# Patient Record
Sex: Male | Born: 1980 | Race: White | Hispanic: No | Marital: Married | Smoking: Current every day smoker
Health system: Southern US, Community
[De-identification: ages and names within clinical notes are randomized; demographics above are authoritative.]

## PROBLEM LIST (undated history)

## (undated) DIAGNOSIS — G4730 Sleep apnea, unspecified: Secondary | ICD-10-CM

## (undated) DIAGNOSIS — F32A Depression, unspecified: Secondary | ICD-10-CM

## (undated) DIAGNOSIS — I10 Essential (primary) hypertension: Secondary | ICD-10-CM

## (undated) DIAGNOSIS — F419 Anxiety disorder, unspecified: Secondary | ICD-10-CM

## (undated) DIAGNOSIS — E119 Type 2 diabetes mellitus without complications: Secondary | ICD-10-CM

## (undated) HISTORY — PX: CHOLECYSTECTOMY: SHX55

## (undated) HISTORY — PX: CARPAL TUNNEL RELEASE: SHX101

## (undated) HISTORY — PX: WRIST TENOLYSIS: SHX1095

## (undated) HISTORY — PX: HEEL SPUR SURGERY: SHX665

---

## 2006-03-15 ENCOUNTER — Emergency Department (HOSPITAL_COMMUNITY): Admission: EM | Admit: 2006-03-15 | Discharge: 2006-03-16 | Payer: Self-pay | Admitting: Emergency Medicine

## 2013-07-03 HISTORY — PX: HERNIA REPAIR: SHX51

## 2016-12-22 ENCOUNTER — Emergency Department (HOSPITAL_COMMUNITY): Payer: 59

## 2016-12-22 ENCOUNTER — Encounter (HOSPITAL_COMMUNITY): Payer: Self-pay

## 2016-12-22 ENCOUNTER — Emergency Department (HOSPITAL_COMMUNITY)
Admission: EM | Admit: 2016-12-22 | Discharge: 2016-12-22 | Disposition: A | Discharge status: Home / Self Care | Payer: 59 | Attending: Emergency Medicine | Admitting: Emergency Medicine

## 2016-12-22 DIAGNOSIS — T07XXXA Unspecified multiple injuries, initial encounter: Secondary | ICD-10-CM

## 2016-12-22 DIAGNOSIS — R918 Other nonspecific abnormal finding of lung field: Secondary | ICD-10-CM | POA: Diagnosis not present

## 2016-12-22 DIAGNOSIS — Y939 Activity, unspecified: Secondary | ICD-10-CM | POA: Diagnosis not present

## 2016-12-22 DIAGNOSIS — F17200 Nicotine dependence, unspecified, uncomplicated: Secondary | ICD-10-CM | POA: Insufficient documentation

## 2016-12-22 DIAGNOSIS — Z79899 Other long term (current) drug therapy: Secondary | ICD-10-CM | POA: Insufficient documentation

## 2016-12-22 DIAGNOSIS — Y999 Unspecified external cause status: Secondary | ICD-10-CM | POA: Insufficient documentation

## 2016-12-22 DIAGNOSIS — K760 Fatty (change of) liver, not elsewhere classified: Secondary | ICD-10-CM | POA: Insufficient documentation

## 2016-12-22 DIAGNOSIS — R55 Syncope and collapse: Secondary | ICD-10-CM | POA: Diagnosis not present

## 2016-12-22 DIAGNOSIS — Y92410 Unspecified street and highway as the place of occurrence of the external cause: Secondary | ICD-10-CM | POA: Insufficient documentation

## 2016-12-22 DIAGNOSIS — S51812A Laceration without foreign body of left forearm, initial encounter: Secondary | ICD-10-CM | POA: Diagnosis not present

## 2016-12-22 DIAGNOSIS — S80212A Abrasion, left knee, initial encounter: Secondary | ICD-10-CM | POA: Diagnosis not present

## 2016-12-22 DIAGNOSIS — S0181XA Laceration without foreign body of other part of head, initial encounter: Secondary | ICD-10-CM | POA: Diagnosis not present

## 2016-12-22 DIAGNOSIS — I10 Essential (primary) hypertension: Secondary | ICD-10-CM | POA: Insufficient documentation

## 2016-12-22 DIAGNOSIS — S0101XA Laceration without foreign body of scalp, initial encounter: Secondary | ICD-10-CM | POA: Diagnosis not present

## 2016-12-22 DIAGNOSIS — S0990XA Unspecified injury of head, initial encounter: Secondary | ICD-10-CM | POA: Diagnosis present

## 2016-12-22 HISTORY — DX: Essential (primary) hypertension: I10

## 2016-12-22 LAB — URINALYSIS, ROUTINE W REFLEX MICROSCOPIC
Bilirubin Urine: NEGATIVE
GLUCOSE, UA: NEGATIVE mg/dL
HGB URINE DIPSTICK: NEGATIVE
Ketones, ur: NEGATIVE mg/dL
LEUKOCYTES UA: NEGATIVE
NITRITE: NEGATIVE
PH: 6 (ref 5.0–8.0)
Protein, ur: 100 mg/dL — AB
Specific Gravity, Urine: 1.027 (ref 1.005–1.030)

## 2016-12-22 LAB — I-STAT CHEM 8, ED
BUN: 10 mg/dL (ref 6–20)
BUN: 11 mg/dL (ref 6–20)
CALCIUM ION: 1.05 mmol/L — AB (ref 1.15–1.40)
CHLORIDE: 102 mmol/L (ref 101–111)
CREATININE: 0.9 mg/dL (ref 0.61–1.24)
CREATININE: 0.8 mg/dL (ref 0.61–1.24)
Calcium, Ion: 1.06 mmol/L — ABNORMAL LOW (ref 1.15–1.40)
Chloride: 102 mmol/L (ref 101–111)
GLUCOSE: 76 mg/dL (ref 65–99)
GLUCOSE: 80 mg/dL (ref 65–99)
HEMATOCRIT: 44 % (ref 39.0–52.0)
HEMATOCRIT: 41 % (ref 39.0–52.0)
HEMOGLOBIN: 15 g/dL (ref 13.0–17.0)
HEMOGLOBIN: 13.9 g/dL (ref 13.0–17.0)
POTASSIUM: 4 mmol/L (ref 3.5–5.1)
Potassium: 3.8 mmol/L (ref 3.5–5.1)
Sodium: 140 mmol/L (ref 135–145)
Sodium: 140 mmol/L (ref 135–145)
TCO2: 26 mmol/L (ref 0–100)
TCO2: 26 mmol/L (ref 0–100)

## 2016-12-22 LAB — CBC
HCT: 43.5 % (ref 39.0–52.0)
Hemoglobin: 14.4 g/dL (ref 13.0–17.0)
MCH: 30 pg (ref 26.0–34.0)
MCHC: 33.1 g/dL (ref 30.0–36.0)
MCV: 90.6 fL (ref 78.0–100.0)
Platelets: 331 10*3/uL (ref 150–400)
RBC: 4.8 MIL/uL (ref 4.22–5.81)
RDW: 14.1 % (ref 11.5–15.5)
WBC: 16.2 10*3/uL — ABNORMAL HIGH (ref 4.0–10.5)

## 2016-12-22 LAB — COMPREHENSIVE METABOLIC PANEL
ALBUMIN: 3.3 g/dL — AB (ref 3.5–5.0)
ALK PHOS: 67 U/L (ref 38–126)
ALT: 36 U/L (ref 17–63)
ANION GAP: 8 (ref 5–15)
AST: 37 U/L (ref 15–41)
BUN: 9 mg/dL (ref 6–20)
CALCIUM: 8.4 mg/dL — AB (ref 8.9–10.3)
CO2: 24 mmol/L (ref 22–32)
Chloride: 104 mmol/L (ref 101–111)
Creatinine, Ser: 0.93 mg/dL (ref 0.61–1.24)
GFR calc non Af Amer: >60 mL/min (ref >60)
GLUCOSE: 79 mg/dL (ref 65–99)
POTASSIUM: 3.8 mmol/L (ref 3.5–5.1)
SODIUM: 136 mmol/L (ref 135–145)
TOTAL PROTEIN: 6.5 g/dL (ref 6.5–8.1)
Total Bilirubin: 0.5 mg/dL (ref 0.3–1.2)

## 2016-12-22 LAB — SAMPLE TO BLOOD BANK

## 2016-12-22 LAB — ETHANOL

## 2016-12-22 LAB — PROTIME-INR
INR: 1.06
Prothrombin Time: 13.8 seconds (ref 11.4–15.2)

## 2016-12-22 LAB — I-STAT CG4 LACTIC ACID, ED: Lactic Acid, Venous: 1.73 mmol/L (ref 0.5–1.9)

## 2016-12-22 MED ORDER — MELOXICAM 15 MG PO TABS: 15.0000 mg | ORAL_TABLET | Freq: Every day | ORAL | 0 refills | Status: DC

## 2016-12-22 MED ORDER — BACLOFEN 10 MG PO TABS: 10.0000 mg | ORAL_TABLET | Freq: Three times a day (TID) | ORAL | 0 refills | Status: DC

## 2016-12-22 MED ORDER — SODIUM CHLORIDE 0.9 % IV SOLN
Freq: Once | INTRAVENOUS | Status: AC
  Administered 2016-12-22: 2000 mL/h via INTRAVENOUS

## 2016-12-22 MED ORDER — IOPAMIDOL (ISOVUE-300) INJECTION 61%
INTRAVENOUS | Status: AC
  Administered 2016-12-22: 100 mL
  Filled 2016-12-22: qty 100

## 2016-12-22 NOTE — Discharge Instructions (Signed)
Your CT scan did show Fatty liver. You may read about this diagnosis. It is not an emergent condition.  Return to the emergency department immediately if you develop any of the following symptoms: You have numbness, tingling, or weakness in the arms or legs. You develop severe headaches not relieved with medicine. You have severe neck pain, especially tenderness in the middle of the back of your neck. You have changes in bowel or bladder control. There is increasing pain in any area of the body. You have shortness of breath, light-headedness, dizziness, or fainting. You have chest pain. You feel sick to your stomach (nauseous), throw up (vomit), or sweat. You have increasing abdominal discomfort. There is blood in your urine, stool, or vomit. You have pain in your shoulder (shoulder strap areas). You feel your symptoms are getting worse.
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
## 2016-12-22 NOTE — ED Notes (Signed)
Pt c/o stinging at left forearm.

## 2016-12-22 NOTE — ED Notes (Signed)
Dr. Rubin PayorPickering at bedside with fast ultrasound

## 2016-12-22 NOTE — ED Notes (Signed)
Patient transported to CT 
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
## 2016-12-22 NOTE — ED Notes (Signed)
PA harris at bedside with dermabon and pt becomes dizzy with bp decreased. Hob down.

## 2016-12-22 NOTE — ED Notes (Signed)
Patient to xray.

## 2016-12-22 NOTE — ED Notes (Signed)
Pt verbalized understanding discharge instructions and denies any further needs or questions at this time. VS stable, ambulatory and steady gait.   

## 2016-12-22 NOTE — ED Notes (Signed)
Pt talkiing on phone with family. Denies complaint.

## 2017-01-30 ENCOUNTER — Encounter: Payer: Self-pay | Admitting: Neurology

## 2017-01-30 ENCOUNTER — Ambulatory Visit (INDEPENDENT_AMBULATORY_CARE_PROVIDER_SITE_OTHER): Payer: 59 | Admitting: Neurology

## 2017-01-30 ENCOUNTER — Ambulatory Visit (INDEPENDENT_AMBULATORY_CARE_PROVIDER_SITE_OTHER): Payer: Self-pay | Admitting: Neurology

## 2017-01-30 ENCOUNTER — Encounter (INDEPENDENT_AMBULATORY_CARE_PROVIDER_SITE_OTHER): Payer: Self-pay

## 2017-01-30 DIAGNOSIS — G5732 Lesion of lateral popliteal nerve, left lower limb: Secondary | ICD-10-CM | POA: Diagnosis not present

## 2017-01-30 NOTE — Progress Notes (Signed)
Please refer to EMG and nerve conduction study procedure note. 
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
## 2017-02-01 NOTE — Progress Notes (Signed)
    [facility]    Nerve / Sites Muscle Latency Ref. Amplitude Ref. Rel Amp Segments Distance Velocity Ref. Area    ms ms mV mV %  cm m/s m/s mVms  L Peroneal - EDB     Ankle EDB 5.4 ?6.5 1.7 ?2.0 100 Ankle - EDB 9   4.6     Fib head EDB 13.1  1.6  94.9 Fib head - Ankle 38 49 ?44 4.8     Pop fossa EDB 14.9  1.6  97.5 Pop fossa - Fib head 10 53 ?44 4.7         Pop fossa - Ankle      R Peroneal - EDB     Ankle EDB 4.8 ?6.5 7.2 ?2.0 100 Ankle - EDB 9   28.6     Fib head EDB 13.2  6.7  93.1 Fib head - Ankle 37 44 ?44 28.1     Pop fossa EDB 14.6  5.0  73.9 Pop fossa - Fib head 8 55 ?44 17.7         Pop fossa - Ankle      L Tibial - AH     Ankle AH 4.2 ?5.8 7.3 ?4.0 100 Ankle - AH 9   19.0     Pop fossa AH 13.5  4.5  61.1 Pop fossa - Ankle 42 45 ?41 14.0  R Tibial - AH     Ankle AH 5.0 ?5.8 8.5 ?4.0 100 Ankle - AH 9   20.1     Pop fossa AH 14.9  6.1  72.5 Pop fossa - Ankle 42 42 ?41 15.4             SNC    Nerve / Sites Rec. Site Peak Lat Ref.  Amp Ref. Segments Distance    ms ms V V  cm  R Superficial peroneal - Ankle     Lat leg Ankle 4.5 ?4.4 7 ?6 Lat leg - Ankle 14  L Superficial peroneal - Ankle     Lat leg Ankle 4.4 ?4.4 6 ?6 Lat leg - Ankle 14         F  Wave    Nerve F Lat Ref.   ms ms  L Tibial - AH 59.6 ?56.0  R Tibial - AH 55.2 ?56.0         H Reflex    Nerve H Lat Lat Hmax   ms ms   Left Right Ref. Left Right Ref.  Tibial - [place] 33.4 33.6 ?35.0 33.8 33.4 ?35.0         EMG       EMG full
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
## 2018-08-03 HISTORY — PX: SHOULDER ARTHROSCOPY: SHX128

## 2018-08-12 NOTE — Pre-Procedure Instructions (Signed)
Justin Richardson  08/12/2018      [id number] - [place], [place] - [address] AT [facility] [address] [place] [address] [place] [address] Phone: [phone] Fax: [phone]    Your procedure is scheduled on Feb. 12  Report to [facility] Admitting at 6:30 A.M.  Call this number if you have problems the morning of surgery:  [phone]   Remember:  Do not eat or drink after midnight.      Take these medicines the morning of surgery with A SIP OF WATER :               Fluoxetine (prozac)              Gabapentin (neurontin)                   7 days prior to surgery STOP taking any Aspirin (unless otherwise instructed by your surgeon), Aleve, Naproxen, Ibuprofen, Motrin, Advil, Goody's, BC's, all herbal medications, fish oil, and all vitamins.     Do not wear jewelry.  Do not wear lotions, powders, or perfumes, or deodorant.  Do not shave 48 hours prior to surgery.  Men may shave face and neck.  Do not bring valuables to the hospital.  [facility] is not responsible for any belongings or valuables.  Contacts, dentures or bridgework may not be worn into surgery.  Leave your suitcase in the car.  After surgery it may be brought to your room.  For patients admitted to the hospital, discharge time will be determined by your treatment team.  Patients discharged the day of surgery will not be allowed to drive home.    Special instructions:  [place]- Preparing For Surgery  Before surgery, you can play an important role. Because skin is not sterile, your skin needs to be as free of germs as possible. You can reduce the number of germs on your skin by washing with CHG (chlorahexidine gluconate) Soap before surgery.  CHG is an antiseptic cleaner which kills germs and bonds with the skin to continue killing germs even after washing.    Oral Hygiene is also important to reduce your risk of infection.  Remember - BRUSH YOUR TEETH  THE MORNING OF SURGERY WITH YOUR REGULAR TOOTHPASTE  Please do not use if you have an allergy to CHG or antibacterial soaps. If your skin becomes reddened/irritated stop using the CHG.  Do not shave (including legs and underarms) for at least 48 hours prior to first CHG shower. It is OK to shave your face.  Please follow these instructions carefully.   1. Shower the NIGHT BEFORE SURGERY and the MORNING OF SURGERY with CHG.   2. If you chose to wash your hair, wash your hair first as usual with your normal shampoo.  3. After you shampoo, rinse your hair and body thoroughly to remove the shampoo.  4. Use CHG as you would any other liquid soap. You can apply CHG directly to the skin and wash gently with a scrungie or a clean washcloth.   5. Apply the CHG Soap to your body ONLY FROM THE NECK DOWN.  Do not use on open wounds or open sores. Avoid contact with your eyes, ears, mouth and genitals (private parts). Wash Face and genitals (private parts)  with your normal soap.  6. Wash thoroughly, paying special attention to the area where your surgery will be performed.  7. Thoroughly rinse  your body with warm water from the neck down.  8. DO NOT shower/wash with your normal soap after using and rinsing off the CHG Soap.  9. Pat yourself dry with a CLEAN TOWEL.  10. Wear CLEAN PAJAMAS to bed the night before surgery, wear comfortable clothes the morning of surgery  11. Place CLEAN SHEETS on your bed the night of your first shower and DO NOT SLEEP WITH PETS.    Day of Surgery:  Do not apply any deodorants/lotions.  Please wear clean clothes to the hospital/surgery center.   Remember to brush your teeth WITH YOUR REGULAR TOOTHPASTE.    Please read over the following fact sheets that you were given. Coughing and Deep Breathing and Surgical Site Infection Prevention

## 2018-08-13 ENCOUNTER — Other Ambulatory Visit: Payer: Self-pay

## 2018-08-13 ENCOUNTER — Encounter (HOSPITAL_COMMUNITY): Payer: Self-pay

## 2018-08-13 ENCOUNTER — Encounter (HOSPITAL_COMMUNITY)
Admission: RE | Admit: 2018-08-13 | Discharge: 2018-08-13 | Disposition: A | Discharge status: Home / Self Care | Payer: 59 | Source: Ambulatory Visit | Attending: Orthopaedic Surgery | Admitting: Orthopaedic Surgery

## 2018-08-13 ENCOUNTER — Encounter (HOSPITAL_COMMUNITY): Payer: Self-pay | Admitting: Anesthesiology

## 2018-08-13 DIAGNOSIS — M7541 Impingement syndrome of right shoulder: Secondary | ICD-10-CM | POA: Diagnosis not present

## 2018-08-13 DIAGNOSIS — Z6841 Body Mass Index (BMI) 40.0 and over, adult: Secondary | ICD-10-CM | POA: Diagnosis not present

## 2018-08-13 DIAGNOSIS — Z01818 Encounter for other preprocedural examination: Secondary | ICD-10-CM | POA: Insufficient documentation

## 2018-08-13 DIAGNOSIS — M7521 Bicipital tendinitis, right shoulder: Secondary | ICD-10-CM | POA: Diagnosis not present

## 2018-08-13 DIAGNOSIS — M19011 Primary osteoarthritis, right shoulder: Secondary | ICD-10-CM | POA: Diagnosis not present

## 2018-08-13 DIAGNOSIS — Z9989 Dependence on other enabling machines and devices: Secondary | ICD-10-CM | POA: Diagnosis not present

## 2018-08-13 DIAGNOSIS — I110 Hypertensive heart disease with heart failure: Secondary | ICD-10-CM | POA: Insufficient documentation

## 2018-08-13 DIAGNOSIS — M719 Bursopathy, unspecified: Secondary | ICD-10-CM | POA: Diagnosis not present

## 2018-08-13 DIAGNOSIS — I10 Essential (primary) hypertension: Secondary | ICD-10-CM | POA: Diagnosis not present

## 2018-08-13 DIAGNOSIS — Z79899 Other long term (current) drug therapy: Secondary | ICD-10-CM | POA: Diagnosis not present

## 2018-08-13 DIAGNOSIS — F17200 Nicotine dependence, unspecified, uncomplicated: Secondary | ICD-10-CM | POA: Diagnosis not present

## 2018-08-13 DIAGNOSIS — S43431A Superior glenoid labrum lesion of right shoulder, initial encounter: Secondary | ICD-10-CM | POA: Diagnosis not present

## 2018-08-13 DIAGNOSIS — G4730 Sleep apnea, unspecified: Secondary | ICD-10-CM | POA: Diagnosis not present

## 2018-08-13 DIAGNOSIS — X58XXXA Exposure to other specified factors, initial encounter: Secondary | ICD-10-CM | POA: Diagnosis not present

## 2018-08-13 HISTORY — DX: Sleep apnea, unspecified: G47.30

## 2018-08-13 LAB — CBC
HCT: 56.6 % — ABNORMAL HIGH (ref 39.0–52.0)
Hemoglobin: 17.5 g/dL — ABNORMAL HIGH (ref 13.0–17.0)
MCH: 28 pg (ref 26.0–34.0)
MCHC: 30.9 g/dL (ref 30.0–36.0)
MCV: 90.6 fL (ref 80.0–100.0)
Platelets: 248 10*3/uL (ref 150–400)
RBC: 6.25 MIL/uL — ABNORMAL HIGH (ref 4.22–5.81)
RDW: 14.5 % (ref 11.5–15.5)
WBC: 9.6 10*3/uL (ref 4.0–10.5)
nRBC: 0 % (ref 0.0–0.2)

## 2018-08-13 LAB — BASIC METABOLIC PANEL
Anion gap: 9 (ref 5–15)
BUN: 14 mg/dL (ref 6–20)
CHLORIDE: 105 mmol/L (ref 98–111)
CO2: 23 mmol/L (ref 22–32)
Calcium: 8.9 mg/dL (ref 8.9–10.3)
Creatinine, Ser: 1.14 mg/dL (ref 0.61–1.24)
GFR calc Af Amer: >60 mL/min (ref >60)
GFR calc non Af Amer: >60 mL/min (ref >60)
GLUCOSE: 116 mg/dL — AB (ref 70–99)
Potassium: 4.5 mmol/L (ref 3.5–5.1)
SODIUM: 137 mmol/L (ref 135–145)

## 2018-08-13 NOTE — Progress Notes (Addendum)
PCP - Legrand PittsWilliam Shough, PA-C Cardiologist - denies  Chest x-ray - N/A EKG - 08/13/18 Stress Test - denies ECHO - denies Cardiac Cath - denies  Sleep Study - diagnosed with OSA CPAP - uses QHS- does not know pressure settings   Aspirin Instructions: Patient instructed to hold all Aspirin, NSAID's, herbal medications, fish oil and vitamins 7 days prior to surgery.   Anesthesia review:   Patient denies shortness of breath, fever, cough and chest pain at PAT appointment   Patient verbalized understanding of instructions that were given to them at the PAT appointment. Patient was also instructed that they will need to review over the PAT instructions again at home before surgery.

## 2018-08-13 NOTE — Anesthesia Preprocedure Evaluation (Addendum)
Anesthesia Evaluation  Patient identified by MRN, date of birth, ID band Patient awake    Reviewed: Allergy & Precautions, NPO status , Patient's Chart, lab work & pertinent test results  Airway Mallampati: III  TM Distance: >3 FB Neck ROM: Full    Dental  (+) Teeth Intact, Dental Advisory Given   Pulmonary sleep apnea and Continuous Positive Airway Pressure Ventilation , Current Smoker,    Pulmonary exam normal        Cardiovascular hypertension,  Rhythm:Regular Rate:Normal     Neuro/Psych  Neuromuscular disease negative psych ROS   GI/Hepatic negative GI ROS, Neg liver ROS,   Endo/Other  Morbid obesity  Renal/GU negative Renal ROS     Musculoskeletal negative musculoskeletal ROS (+)   Abdominal Normal abdominal exam  (+)   Peds  Hematology negative hematology ROS (+)   Anesthesia Other Findings   Reproductive/Obstetrics                           Anesthesia Physical Anesthesia Plan  ASA: III  Anesthesia Plan: General   Post-op Pain Management: GA combined w/ Regional for post-op pain   Induction: Intravenous  PONV Risk Score and Plan: 2 and Ondansetron, Dexamethasone and Midazolam  Airway Management Planned: Oral ETT  Additional Equipment: None  Intra-op Plan:   Post-operative Plan: Extubation in OR  Informed Consent: I have reviewed the patients History and Physical, chart, labs and discussed the procedure including the risks, benefits and alternatives for the proposed anesthesia with the patient or authorized representative who has indicated his/her understanding and acceptance.     Dental advisory given  Plan Discussed with: CRNA, Anesthesiologist and Surgeon  Anesthesia Plan Comments:        Anesthesia Quick Evaluation

## 2018-08-14 ENCOUNTER — Ambulatory Visit (HOSPITAL_COMMUNITY): Payer: 59 | Admitting: Anesthesiology

## 2018-08-14 ENCOUNTER — Ambulatory Visit (HOSPITAL_COMMUNITY)
Admission: RE | Admit: 2018-08-14 | Discharge: 2018-08-14 | Disposition: A | Discharge status: Home / Self Care | Payer: 59 | Attending: Orthopaedic Surgery | Admitting: Orthopaedic Surgery

## 2018-08-14 ENCOUNTER — Encounter (HOSPITAL_COMMUNITY)
Admission: RE | Disposition: A | Discharge status: Home / Self Care | Payer: Self-pay | Source: Home / Self Care | Attending: Orthopaedic Surgery

## 2018-08-14 ENCOUNTER — Other Ambulatory Visit: Payer: Self-pay

## 2018-08-14 ENCOUNTER — Encounter (HOSPITAL_COMMUNITY): Payer: Self-pay | Admitting: Certified Registered Nurse Anesthetist

## 2018-08-14 DIAGNOSIS — I10 Essential (primary) hypertension: Secondary | ICD-10-CM | POA: Insufficient documentation

## 2018-08-14 DIAGNOSIS — F17200 Nicotine dependence, unspecified, uncomplicated: Secondary | ICD-10-CM | POA: Insufficient documentation

## 2018-08-14 DIAGNOSIS — M19011 Primary osteoarthritis, right shoulder: Secondary | ICD-10-CM | POA: Insufficient documentation

## 2018-08-14 DIAGNOSIS — X58XXXA Exposure to other specified factors, initial encounter: Secondary | ICD-10-CM | POA: Insufficient documentation

## 2018-08-14 DIAGNOSIS — Z9989 Dependence on other enabling machines and devices: Secondary | ICD-10-CM | POA: Insufficient documentation

## 2018-08-14 DIAGNOSIS — S43431A Superior glenoid labrum lesion of right shoulder, initial encounter: Secondary | ICD-10-CM | POA: Insufficient documentation

## 2018-08-14 DIAGNOSIS — M7541 Impingement syndrome of right shoulder: Secondary | ICD-10-CM | POA: Diagnosis not present

## 2018-08-14 DIAGNOSIS — G4730 Sleep apnea, unspecified: Secondary | ICD-10-CM | POA: Insufficient documentation

## 2018-08-14 DIAGNOSIS — Z79899 Other long term (current) drug therapy: Secondary | ICD-10-CM | POA: Insufficient documentation

## 2018-08-14 DIAGNOSIS — Z6841 Body Mass Index (BMI) 40.0 and over, adult: Secondary | ICD-10-CM | POA: Insufficient documentation

## 2018-08-14 DIAGNOSIS — M719 Bursopathy, unspecified: Secondary | ICD-10-CM | POA: Insufficient documentation

## 2018-08-14 DIAGNOSIS — M7521 Bicipital tendinitis, right shoulder: Secondary | ICD-10-CM | POA: Insufficient documentation

## 2018-08-14 SURGERY — SHOULDER ARTHROSCOPY WITH SUBACROMIAL DECOMPRESSION AND DISTAL CLAVICLE EXCISION
Anesthesia: Regional | Site: Shoulder | Laterality: Right

## 2018-08-14 MED ORDER — FENTANYL CITRATE (PF) 100 MCG/2ML IJ SOLN
INTRAMUSCULAR | Status: DC | PRN
  Administered 2018-08-14: 75 ug via INTRAVENOUS
  Administered 2018-08-14 (×2): 50 ug via INTRAVENOUS

## 2018-08-14 MED ORDER — ROCURONIUM BROMIDE 50 MG/5ML IV SOSY
PREFILLED_SYRINGE | INTRAVENOUS | Status: AC
  Filled 2018-08-14: qty 5

## 2018-08-14 MED ORDER — PROPOFOL 10 MG/ML IV BOLUS
INTRAVENOUS | Status: AC
  Filled 2018-08-14: qty 40

## 2018-08-14 MED ORDER — PHENYLEPHRINE 40 MCG/ML (10ML) SYRINGE FOR IV PUSH (FOR BLOOD PRESSURE SUPPORT)
PREFILLED_SYRINGE | INTRAVENOUS | Status: DC | PRN
  Administered 2018-08-14: 160 ug via INTRAVENOUS
  Administered 2018-08-14: 240 ug via INTRAVENOUS

## 2018-08-14 MED ORDER — SUGAMMADEX SODIUM 200 MG/2ML IV SOLN
INTRAVENOUS | Status: DC | PRN
  Administered 2018-08-14: 200 mg via INTRAVENOUS

## 2018-08-14 MED ORDER — LIDOCAINE 2% (20 MG/ML) 5 ML SYRINGE
INTRAMUSCULAR | Status: DC | PRN
  Administered 2018-08-14: 40 mg via INTRAVENOUS

## 2018-08-14 MED ORDER — ACETAMINOPHEN 325 MG PO TABS
325.0000 mg | ORAL_TABLET | Freq: Once | ORAL | Status: AC
  Administered 2018-08-14: 650 mg via ORAL

## 2018-08-14 MED ORDER — MIDAZOLAM HCL 5 MG/5ML IJ SOLN
INTRAMUSCULAR | Status: DC | PRN
  Administered 2018-08-14: 2 mg via INTRAVENOUS

## 2018-08-14 MED ORDER — LACTATED RINGERS IV SOLN: INTRAVENOUS | Status: DC

## 2018-08-14 MED ORDER — SODIUM CHLORIDE 0.9 % IV SOLN
INTRAVENOUS | Status: DC | PRN
  Administered 2018-08-14: 40 ug/min via INTRAVENOUS

## 2018-08-14 MED ORDER — ROCURONIUM BROMIDE 10 MG/ML (PF) SYRINGE
PREFILLED_SYRINGE | INTRAVENOUS | Status: DC | PRN
  Administered 2018-08-14: 20 mg via INTRAVENOUS
  Administered 2018-08-14: 80 mg via INTRAVENOUS
  Administered 2018-08-14: 20 mg via INTRAVENOUS

## 2018-08-14 MED ORDER — MELOXICAM 7.5 MG PO TABS: 7.5000 mg | ORAL_TABLET | Freq: Every day | ORAL | 2 refills | Status: AC

## 2018-08-14 MED ORDER — MIDAZOLAM HCL 2 MG/2ML IJ SOLN
INTRAMUSCULAR | Status: AC
  Filled 2018-08-14: qty 2

## 2018-08-14 MED ORDER — SUCCINYLCHOLINE CHLORIDE 200 MG/10ML IV SOSY
PREFILLED_SYRINGE | INTRAVENOUS | Status: DC | PRN
  Administered 2018-08-14: 160 mg via INTRAVENOUS

## 2018-08-14 MED ORDER — ONDANSETRON HCL 4 MG PO TABS: 4.0000 mg | ORAL_TABLET | Freq: Three times a day (TID) | ORAL | 1 refills | Status: AC | PRN

## 2018-08-14 MED ORDER — DEXTROSE 5 % IV SOLN
3.0000 g | INTRAVENOUS | Status: AC
  Administered 2018-08-14: 3 g via INTRAVENOUS
  Filled 2018-08-14: qty 3

## 2018-08-14 MED ORDER — BUPIVACAINE LIPOSOME 1.3 % IJ SUSP
INTRAMUSCULAR | Status: DC | PRN
  Administered 2018-08-14: 10 mL

## 2018-08-14 MED ORDER — PROPOFOL 10 MG/ML IV BOLUS
INTRAVENOUS | Status: DC | PRN
  Administered 2018-08-14: 200 mg via INTRAVENOUS

## 2018-08-14 MED ORDER — ACETAMINOPHEN 160 MG/5ML PO SOLN: 325.0000 mg | Freq: Once | ORAL | Status: AC

## 2018-08-14 MED ORDER — ONDANSETRON HCL 4 MG/2ML IJ SOLN
INTRAMUSCULAR | Status: AC
  Filled 2018-08-14: qty 2

## 2018-08-14 MED ORDER — HYDROMORPHONE HCL 1 MG/ML IJ SOLN: 0.2500 mg | INTRAMUSCULAR | Status: DC | PRN

## 2018-08-14 MED ORDER — ACETAMINOPHEN 500 MG PO TABS: 1000.0000 mg | ORAL_TABLET | Freq: Three times a day (TID) | ORAL | 0 refills | Status: AC

## 2018-08-14 MED ORDER — EPHEDRINE SULFATE-NACL 50-0.9 MG/10ML-% IV SOSY
PREFILLED_SYRINGE | INTRAVENOUS | Status: DC | PRN
  Administered 2018-08-14: 15 mg via INTRAVENOUS
  Administered 2018-08-14: 25 mg via INTRAVENOUS
  Administered 2018-08-14 (×4): 15 mg via INTRAVENOUS

## 2018-08-14 MED ORDER — PROMETHAZINE HCL 25 MG/ML IJ SOLN: 6.2500 mg | INTRAMUSCULAR | Status: DC | PRN

## 2018-08-14 MED ORDER — SUCCINYLCHOLINE CHLORIDE 200 MG/10ML IV SOSY
PREFILLED_SYRINGE | INTRAVENOUS | Status: AC
  Filled 2018-08-14: qty 10

## 2018-08-14 MED ORDER — STERILE WATER FOR IRRIGATION IR SOLN
Status: DC | PRN
  Administered 2018-08-14: 1000 mL

## 2018-08-14 MED ORDER — ACETAMINOPHEN 325 MG PO TABS
ORAL_TABLET | ORAL | Status: AC
  Filled 2018-08-14: qty 2

## 2018-08-14 MED ORDER — OXYCODONE HCL 5 MG PO TABS: ORAL_TABLET | ORAL | 0 refills | Status: AC

## 2018-08-14 MED ORDER — SODIUM CHLORIDE 0.9 % IR SOLN
Status: DC | PRN
  Administered 2018-08-14: 6000 mL

## 2018-08-14 MED ORDER — FENTANYL CITRATE (PF) 250 MCG/5ML IJ SOLN
INTRAMUSCULAR | Status: AC
  Filled 2018-08-14: qty 5

## 2018-08-14 MED ORDER — LACTATED RINGERS IV SOLN
INTRAVENOUS | Status: DC | PRN
  Administered 2018-08-14 (×2): via INTRAVENOUS

## 2018-08-14 MED ORDER — ACETAMINOPHEN 10 MG/ML IV SOLN: 1000.0000 mg | Freq: Once | INTRAVENOUS | Status: DC | PRN

## 2018-08-14 MED ORDER — BUPIVACAINE HCL (PF) 0.5 % IJ SOLN
INTRAMUSCULAR | Status: DC | PRN
  Administered 2018-08-14: 20 mL

## 2018-08-14 MED ORDER — DEXAMETHASONE SODIUM PHOSPHATE 10 MG/ML IJ SOLN
INTRAMUSCULAR | Status: DC | PRN
  Administered 2018-08-14: 4 mg via INTRAVENOUS

## 2018-08-14 MED ORDER — CHLORHEXIDINE GLUCONATE 4 % EX LIQD: 60.0000 mL | Freq: Once | CUTANEOUS | Status: DC

## 2018-08-14 MED ORDER — ONDANSETRON HCL 4 MG/2ML IJ SOLN
INTRAMUSCULAR | Status: DC | PRN
  Administered 2018-08-14: 4 mg via INTRAVENOUS

## 2018-08-14 MED ORDER — MEPERIDINE HCL 50 MG/ML IJ SOLN: 6.2500 mg | INTRAMUSCULAR | Status: DC | PRN

## 2018-08-14 SURGICAL SUPPLY — 62 items
AID PSTN UNV HD RSTRNT DISP (MISCELLANEOUS) ×1
APL SKNCLS STERI-STRIP NONHPOA (GAUZE/BANDAGES/DRESSINGS) ×1
BENZOIN TINCTURE PRP APPL 2/3 (GAUZE/BANDAGES/DRESSINGS) ×3 IMPLANT
BLADE CUTTER GATOR 3.5 (BLADE) ×3 IMPLANT
BLADE EXCALIBUR 4.0MM X 13CM (MISCELLANEOUS) ×1
BLADE EXCALIBUR 4.0X13 (MISCELLANEOUS) ×2 IMPLANT
BUR OVAL 6.0 (BURR) ×3 IMPLANT
BURR OVAL 8 FLU 4.0MM X 13CM (MISCELLANEOUS) ×1
BURR OVAL 8 FLU 4.0X13 (MISCELLANEOUS) ×1 IMPLANT
CANNULA 5.75X71 LONG (CANNULA) IMPLANT
CANNULA TWIST IN 8.25X7CM (CANNULA) ×3 IMPLANT
CLOSURE STERI-STRIP 1/2X4 (GAUZE/BANDAGES/DRESSINGS) ×1
CLOSURE WOUND 1/2 X4 (GAUZE/BANDAGES/DRESSINGS)
CLSR STERI-STRIP ANTIMIC 1/2X4 (GAUZE/BANDAGES/DRESSINGS) ×2 IMPLANT
COVER WAND RF STERILE (DRAPES) ×3 IMPLANT
DRAPE ORTHO SPLIT 77X108 STRL (DRAPES) ×6
DRAPE STERI 35X30 U-POUCH (DRAPES) ×6 IMPLANT
DRAPE SURG ORHT 6 SPLT 77X108 (DRAPES) ×2 IMPLANT
DRAPE U-SHAPE 47X51 STRL (DRAPES) ×3 IMPLANT
DRSG EMULSION OIL 3X3 NADH (GAUZE/BANDAGES/DRESSINGS) ×3 IMPLANT
DURAPREP 26ML APPLICATOR (WOUND CARE) ×3 IMPLANT
DW OUTFLOW CASSETTE/TUBE SET (MISCELLANEOUS) ×2 IMPLANT
ELECT REM PT RETURN 9FT ADLT (ELECTROSURGICAL)
ELECTRODE REM PT RTRN 9FT ADLT (ELECTROSURGICAL) IMPLANT
GAUZE SPONGE 4X4 12PLY STRL (GAUZE/BANDAGES/DRESSINGS) ×4 IMPLANT
GLOVE BIO SURGEON STRL SZ7 (GLOVE) ×3 IMPLANT
GLOVE BIOGEL PI IND STRL 7.0 (GLOVE) ×1 IMPLANT
GLOVE BIOGEL PI IND STRL 8 (GLOVE) ×1 IMPLANT
GLOVE BIOGEL PI INDICATOR 7.0 (GLOVE) ×2
GLOVE BIOGEL PI INDICATOR 8 (GLOVE) ×2
GLOVE ECLIPSE 8.0 STRL XLNG CF (GLOVE) ×6 IMPLANT
GOWN STRL REUS W/ TWL LRG LVL3 (GOWN DISPOSABLE) ×2 IMPLANT
GOWN STRL REUS W/ TWL XL LVL3 (GOWN DISPOSABLE) ×2 IMPLANT
GOWN STRL REUS W/TWL LRG LVL3 (GOWN DISPOSABLE) ×6
GOWN STRL REUS W/TWL XL LVL3 (GOWN DISPOSABLE) ×6
KIT BASIN OR (CUSTOM PROCEDURE TRAY) ×3 IMPLANT
MANIFOLD NEPTUNE II (INSTRUMENTS) ×3 IMPLANT
NDL SCORPION MULTI FIRE (NEEDLE) IMPLANT
NEEDLE SCORPION MULTI FIRE (NEEDLE) ×3 IMPLANT
NS IRRIG 1000ML POUR BTL (IV SOLUTION) IMPLANT
PACK ARTHROSCOPY DSU (CUSTOM PROCEDURE TRAY) ×3 IMPLANT
PACK SHOULDER (CUSTOM PROCEDURE TRAY) ×3 IMPLANT
PAD ABD 8X10 STRL (GAUZE/BANDAGES/DRESSINGS) ×6 IMPLANT
PORT APPOLLO RF 90DEGREE MULTI (SURGICAL WAND) ×2 IMPLANT
RESTRAINT HEAD UNIVERSAL NS (MISCELLANEOUS) ×3 IMPLANT
SET ARTHROSCOPY TUBING (MISCELLANEOUS) ×3
SET ARTHROSCOPY TUBING LN (MISCELLANEOUS) ×1 IMPLANT
SLING ARM FOAM STRAP MED (SOFTGOODS) IMPLANT
SLING ARM FOAM STRAP XLG (SOFTGOODS) ×3 IMPLANT
SLING ARM IMMOBILIZER LRG (SOFTGOODS) IMPLANT
SLING ARM IMMOBILIZER MED (SOFTGOODS) IMPLANT
SLING ARM XL FOAM STRAP (SOFTGOODS) IMPLANT
SPONGE LAP 4X18 RFD (DISPOSABLE) IMPLANT
STRIP CLOSURE SKIN 1/2X4 (GAUZE/BANDAGES/DRESSINGS) IMPLANT
SUT ETHILON 3 0 PS 1 (SUTURE) ×3 IMPLANT
SUT TIGER TAPE 7 IN WHITE (SUTURE) IMPLANT
TAPE CLOTH SURG 6X10 WHT LF (GAUZE/BANDAGES/DRESSINGS) ×2 IMPLANT
TAPE FIBER 2MM 7IN #2 BLUE (SUTURE) IMPLANT
TOWEL OR 17X24 6PK STRL BLUE (TOWEL DISPOSABLE) ×3 IMPLANT
TUBING ARTHROSCOPY IRRIG 16FT (MISCELLANEOUS) ×3 IMPLANT
WAND HAND CNTRL MULTIVAC 90 (MISCELLANEOUS) ×3 IMPLANT
WATER STERILE IRR 3000ML UROMA (IV SOLUTION) ×3 IMPLANT

## 2018-08-14 NOTE — H&P (Signed)
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 Symptoms are rated as moderate to severe, and have been worsening.  This is significantly impairing activities of daily living.  Please see my clinic note for full details on this patient's care.  He has elected for surgical management.   Past Medical History:  Diagnosis Date  . Hypertension   . Sleep apnea    Past Surgical History:  Procedure Laterality Date  . CARPAL TUNNEL RELEASE Bilateral   . CHOLECYSTECTOMY    . HEEL SPUR SURGERY Left   . HERNIA REPAIR  2015  . WRIST TENOLYSIS Right    Social History   Socioeconomic History  . Marital status: Married    Spouse name: Not on file  . Number of children: Not on file  . Years of education: Not on file  . Highest education level: Not on file  Occupational History  . Not on file  Social Needs  . Financial resource strain: Not on file  . Food insecurity:    Worry: Not on file    Inability: Not on file  . Transportation needs:    Medical: Not on file    Non-medical: Not on file  Tobacco Use  . Smoking status: Current Every Day Smoker  . Smokeless tobacco: Former Network engineer and Sexual Activity  . Alcohol use: Yes    Comment: occasional  . Drug use: No  . Sexual activity: Not on file  Lifestyle  . Physical activity:    Days per week: Not on file    Minutes per session: Not on file  . Stress: Not on file  Relationships  . Social connections:    Talks on phone: Not on file    Gets together: Not on file    Attends religious service: Not on file    Active member of club or organization: Not on file    Attends meetings of clubs or organizations: Not on file    Relationship status: Not on file  Other Topics Concern  .  Not on file  Social History Narrative  . Not on file   Family History  Problem Relation Age of Onset  . Hypertension Mother   . Hypertension Father   . Diabetes Father    Allergies  Allergen Reactions  . Beef-Derived Products Anaphylaxis  . Doxycycline     Headache    Prior to Admission medications   Medication Sig Start Date End Date Taking? Authorizing Provider  FLUoxetine (PROZAC) 10 MG capsule Take 10 mg by mouth daily.   Yes [provider]  FLUoxetine (PROZAC) 40 MG capsule Take 40 mg by mouth daily.   Yes [provider]  gabapentin (NEURONTIN) 600 MG tablet Take 600 mg by mouth 3 (three) times daily.   Yes [provider]  ibuprofen (ADVIL,MOTRIN) 800 MG tablet Take 800 mg by mouth every 8 (eight) hours as needed (pain).   Yes [provider]  lisinopril (PRINIVIL,ZESTRIL) 40 MG tablet Take 40 mg by mouth daily.   Yes [provider]  Testosterone Cypionate 200 MG/ML KIT Inject 200 mg into the muscle every 14 (fourteen) days. Tuesdays.   Yes [provider]     Positive ROS: All other systems have been reviewed and were otherwise negative with the  exception of those mentioned in the HPI and as above.  Physical Exam: General: Alert, no acute distress Cardiovascular: No pedal edema Respiratory: No cyanosis, no use of accessory musculature GI: No organomegaly, abdomen is soft and non-tender Skin: No lesions in the area of chief complaint Neurologic: Sensation intact distally Psychiatric: Patient is competent for consent with normal mood and affect Lymphatic: No axillary or cervical lymphadenopathy  MUSCULOSKELETAL: R shoulder + ac ttp, + impingement, full rom, obriens positive.  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   Hiram Gash, MD  08/14/2018 7:30 AM

## 2018-08-14 NOTE — Transfer of Care (Signed)
Immediate Anesthesia Transfer of Care Note  Patient: Justin Richardson  Procedure(s) Performed: RIGHT SHOULDER ARTHROSCOPY WITH SUBACROMIAL DECOMPRESSION AND DISTAL CLAVICLE EXCISION BICEPS TENODESIS WITH DEBRIDEMENT (Right Shoulder)  Patient Location: PACU  Anesthesia Type:GA combined with regional for post-op pain  Level of Consciousness: awake, alert , oriented and patient cooperative  Airway & Oxygen Therapy: Patient Spontanous Breathing and Patient connected to nasal cannula oxygen  Post-op Assessment: Report given to RN, Post -op Vital signs reviewed and stable and Patient moving all extremities X 4  Post vital signs: Reviewed and stable  Last Vitals:  Vitals Value Taken Time  BP 139/79 08/14/2018 10:40 AM  Temp    Pulse 107 08/14/2018 10:42 AM  Resp 21 08/14/2018 10:42 AM  SpO2 92 % 08/14/2018 10:42 AM  Vitals shown include unvalidated device data.  Last Pain:  Vitals:   08/14/18 0733  TempSrc: Oral  PainSc:          Complications: No apparent anesthesia complications

## 2018-08-14 NOTE — Op Note (Signed)
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  Patient's large size made procedure much more difficult.  Felt he was appropriate for tenotomy.  The intra-articular space looked pristine and the cuff looked intact.  Cuff was intact on the bursal side as well but he had a significant bursitis.  Cartilage was pristine.  He had a type II SLAP tear and we performed a tenotomy and debrided the labral stump.  Type II acromion converted to type I and a 10 mm distal clavicle resection performed  Post-operative plan: The patient will be sling for 1 week nonweightbearing.  The patient will be charged home.  DVT prophylaxis indicated in ambulatory upper extremity patient.  Pain control with PRN pain medication preferring oral medicines.  Follow up plan will be scheduled in approximately 7 days for incision check and XR.  Post-Op Diagnosis: Same Surgeons:Primary: Bjorn Pippin, MD Assistants: Janace Litten, OPAC Location: [facility] OR ROOM 06 Anesthesia: General Antibiotics: Ancef 2g preop Tourniquet time: * No tourniquets in log * Estimated Blood Loss: Minimal Complications: None Specimens: None Implants: * No implants in log *  Indications for Surgery:   Justin Richardson is a 38 y.o. male with continued right shoulder pain and concern for possible radicular symptoms.  He was worked up by neurosurgery had no findings that were actionable.  He had improvement with an injection of subacromial space but not complete resolution of his symptoms and it did not provide lasting relief.  He tried home exercises as well as anti-inflammatories and a little relief.   Benefits and risks of operative and nonoperative management were discussed prior to surgery with patient/guardian(s) and informed consent form was completed.  Specific risks including infection, need for additional surgery, continued pain, cuff pathology later, stiffness.   Procedure:   The patient was identified in the preoperative holding area where the surgical site was marked. The patient was taken to the OR where a procedural timeout was called and the above noted anesthesia was induced.  The patient was positioned beachchair on [employer] table.  Positioning was very difficult due to the size and had to be repositioned twice.  Preoperative antibiotics were dosed.  The patient's right shoulder was prepped and draped in the usual sterile fashion.  A second preoperative timeout was called.      We began with standard arthroscopic portals anteriorly and posteriorly there were made with spinal needle localization to the patient size.  We performed a diagnostic arthroscopy which is commented on above.  Extensive debridement of the labrum, biceps in the bicipital stump after biceps tenotomy was performed was all completed with a shaving device.  The rotator cuff was intact as was the cartilage surface and the remainder of the labrum other than the superior labrum.  We then moved our scope to the subacromial space.  Subacromial decompression: We made a lateral portal with spinal needle guidance. We then proceeded to debride bursal tissue extensively with a shaver and arthrocare device. At that point we continued to identify the borders of the acromion and identify the spur. We then carefully preserved the deltoid fascia and used a burr to convert the type 2 acromion to a Type 1 flat acromion without issue.  Distal Clavicle  resection:  The scope was placed in the subacromial space from the posterior portal.  A hemostat was placed through the anterior portal and we spread at the [facility] joint.  A burr was then inserted  and 10 mm of distal clavicle was resected taking care to avoid damage to the capsule around the joint and avoiding overhanging bone posteriorly.    Incisions were closed with absorbable suture and a sterile dressing was placed as well as a sling.  Patient awoken taken to PACU in stable condition.  Janace Litten, OPA-C, present and scrubbed throughout the case, critical for completion in a timely fashion, and for retraction, instrumentation, closure.

## 2018-08-14 NOTE — Anesthesia Postprocedure Evaluation (Signed)
Anesthesia Post Note  Patient: Justin Richardson  Procedure(s) Performed: RIGHT SHOULDER ARTHROSCOPY WITH SUBACROMIAL DECOMPRESSION AND DISTAL CLAVICLE EXCISION BICEPS TENODESIS WITH DEBRIDEMENT (Right Shoulder)     Patient location during evaluation: PACU Anesthesia Type: Regional and General Level of consciousness: awake and alert Pain management: pain level controlled Vital Signs Assessment: post-procedure vital signs reviewed and stable Respiratory status: spontaneous breathing, nonlabored ventilation, respiratory function stable and patient connected to nasal cannula oxygen Cardiovascular status: blood pressure returned to baseline and stable Postop Assessment: no apparent nausea or vomiting Anesthetic complications: no    Last Vitals:  Vitals:   08/14/18 1238 08/14/18 1240  BP: 105/60 (!) 107/58  Pulse: (!) 103 100  Resp: 20 (!) 22  Temp:  36.5 C  SpO2: 94% 96%    Last Pain:  Vitals:   08/14/18 1240  TempSrc:   PainSc: 0-No pain                 Shelton Silvas

## 2018-08-14 NOTE — Anesthesia Procedure Notes (Signed)
Anesthesia Regional Block: Interscalene brachial plexus block   Pre-Anesthetic Checklist: ,, timeout performed, Correct Patient, Correct Site, Correct Laterality, Correct Procedure, Correct Position, site marked, Risks and benefits discussed,  Surgical consent,  Pre-op evaluation,  At surgeon's request and post-op pain management  Laterality: Right  Prep: chloraprep       Needles:  Injection technique: Single-shot  Needle Type: Echogenic Stimulator Needle     Needle Length: 9cm  Needle Gauge: 21     Additional Needles:   Procedures:,,,, ultrasound used (permanent image in chart),,,,  Narrative:  Start time: 08/14/2018 8:25 AM End time: 08/14/2018 8:30 AM Injection made incrementally with aspirations every 5 mL.  Performed by: Personally  Anesthesiologist: Shelton Silvas, MD  Additional Notes: Patient tolerated the procedure well. Local anesthetic introduced in an incremental fashion under minimal resistance after negative aspirations. No paresthesias were elicited. After completion of the procedure, no acute issues were identified and patient continued to be monitored by RN.

## 2018-08-14 NOTE — Anesthesia Procedure Notes (Signed)
Procedure Name: Intubation Date/Time: 08/14/2018 8:52 AM Performed by: Carney Living, CRNA Pre-anesthesia Checklist: Patient identified, Emergency Drugs available, Suction available, Patient being monitored and Timeout performed Patient Re-evaluated:Patient Re-evaluated prior to induction Oxygen Delivery Method: Circle system utilized Preoxygenation: Pre-oxygenation with 100% oxygen Induction Type: IV induction Laryngoscope Size: Mac and 4 Grade View: Grade II Tube type: Oral Tube size: 7.5 mm Number of attempts: 1 Airway Equipment and Method: Stylet Placement Confirmation: ETT inserted through vocal cords under direct vision,  positive ETCO2 and breath sounds checked- equal and bilateral Secured at: 22 cm Tube secured with: Tape Dental Injury: Teeth and Oropharynx as per pre-operative assessment

## 2018-09-02 ENCOUNTER — Ambulatory Visit: Payer: 59 | Admitting: Neurology

## 2018-09-02 ENCOUNTER — Encounter

## 2019-12-08 NOTE — Patient Instructions (Addendum)
DUE TO COVID-19 ONLY ONE VISITOR IS ALLOWED TO COME WITH YOU AND STAY IN THE WAITING ROOM ONLY DURING PRE OP AND PROCEDURE DAY OF SURGERY. THE 2 VISITORS  MAY VISIT WITH YOU AFTER SURGERY IN YOUR PRIVATE ROOM DURING VISITING HOURS ONLY!  YOU NEED TO HAVE A COVID 19 TEST ON_Sat. 6/12______ @_11 :45______, THIS TEST MUST BE DONE BEFORE SURGERY, COME  [place], [place] , [address].  ([place]) ONCE YOUR COVID TEST IS COMPLETED, PLEASE BEGIN THE QUARANTINE INSTRUCTIONS AS OUTLINED IN YOUR HANDOUT.                ANAND TEJADA    Your procedure is scheduled on: 12/17/19   Report to [facility] Main  Entrance   Report to admitting at   7:20 AM     Call this number if you have problems the morning of surgery [phone]   Do not eat food After Midnight.   YOU MAY HAVE CLEAR LIQUIDS FROM MIDNIGHT UNTIL 6:30 AM.   At 6:30 AM Please finish the prescribed Pre-Surgery Gatorade drink.   Nothing by mouth after you finish the Gatorade drink !   BRUSH YOUR TEETH MORNING OF SURGERY AND RINSE YOUR MOUTH OUT, NO CHEWING GUM CANDY OR MINTS.     Take these medicines the morning of surgery with A SIP OF WATER: Prozac, Gabapentin.    If you use a C-PAP bring your mask and tubing to the hospital with you   DO NOT TAKE ANY DIABETIC MEDICATIONS DAY OF YOUR SURGERY     How to Manage Your Diabetes Before and After Surgery  Why is it important to control my blood sugar before and after surgery? . Improving blood sugar levels before and after surgery helps healing and can limit problems. . A way of improving blood sugar control is eating a healthy diet by: o  Eating less sugar and carbohydrates o  Increasing activity/exercise o  Talking with your doctor about reaching your blood sugar goals . High blood sugars (greater than 180 mg/dL) can raise your risk of infections and slow your recovery, so you will need to focus on controlling your diabetes during the weeks  before surgery. . Make sure that the doctor who takes care of your diabetes knows about your planned surgery including the date and location.  How do I manage my blood sugar before surgery? . Check your blood sugar at least 4 times a day, starting 2 days before surgery, to make sure that the level is not too high or low. o Check your blood sugar the morning of your surgery when you wake up and every 2 hours until you get to the Short Stay unit. . If your blood sugar is less than 70 mg/dL, you will need to treat for low blood sugar: o Do not take insulin. o Treat a low blood sugar (less than 70 mg/dL) with  cup of clear juice (cranberry or apple), 4 glucose tablets, OR glucose gel. o Recheck blood sugar in 15 minutes after treatment (to make sure it is greater than 70 mg/dL). If your blood sugar is not greater than 70 mg/dL on recheck, call [phone] for further instructions. . Report your blood sugar to the short stay nurse when you get to Short Stay.  . If you are admitted to the hospital after surgery: o Your blood sugar will be checked by the staff and you will probably be given insulin after surgery (instead of oral  diabetes medicines) to make sure you have good blood sugar levels. o The goal for blood sugar control after surgery is 80-180 mg/dL.   WHAT DO I DO ABOUT MY DIABETES MEDICATION?  Marland Kitchen Do not take oral diabetes medicines (pills) the morning of surgery.  . The day of surgery, do not take other diabetes injectables, including Byetta (exenatide), Bydureon (exenatide ER), Victoza (liraglutide), or Trulicity (dulaglutide).                          You may not have any metal on your body including              piercings  Do not wear jewelry, lotions, powders or deodorant                        Men may shave face and neck.   Do not bring valuables to the hospital. [facility] IS NOT             RESPONSIBLE   FOR VALUABLES.  Contacts, dentures or bridgework may not be worn into  surgery.       Patients discharged the day of surgery will not be allowed to drive home.   IF YOU ARE HAVING SURGERY AND GOING HOME THE SAME DAY, YOU MUST HAVE AN ADULT TO DRIVE YOU HOME AND BE WITH YOU FOR 24 HOURS.   YOU MAY GO HOME BY TAXI OR UBER OR ORTHERWISE, BUT AN ADULT MUST ACCOMPANY YOU HOME AND STAY WITH YOU FOR 24 HOURS.  Name and phone number of your driver:  Special Instructions: N/A              Please read over the following fact sheets you were given: _____________________________________________________________________             [facility] - Preparing for Surgery Before surgery, you can play an important role.   Because skin is not sterile, your skin needs to be as free of germs as possible.   You can reduce the number of germs on your skin by washing with CHG (chlorahexidine gluconate) soap before surgery.   CHG is an antiseptic cleaner which kills germs and bonds with the skin to continue killing germs even after washing. Please DO NOT use if you have an allergy to CHG or antibacterial soap s.  If your skin becomes reddened/irritated stop using the CHG and inform your nurse when you arrive at Short Stay. You may shave your face/neck.  Please follow these instructions carefully:  1.  Shower with CHG Soap the night before surgery and the  morning of Surgery.  2.  If you choose to wash your hair, wash your hair first as usual with your  normal  shampoo.  3.  After you shampoo, rinse your hair and body thoroughly to remove the  shampoo.                                        4.  Use CHG as you would any other liquid soap.  You can apply chg directly  to the skin and wash                       Gently with a scrungie or clean washcloth.  5.  Apply the CHG Soap to your body ONLY FROM  THE NECK DOWN.   Do not use on face/ open                           Wound or open sores. Avoid contact with eyes, ears mouth and genitals (private parts).                       Wash  face,  Genitals (private parts) with your normal soap.             6.  Wash thoroughly, paying special attention to the area where your surgery  will be performed.  7.  Thoroughly rinse your body with warm water from the neck down.  8.  DO NOT shower/wash with your normal soap after using and rinsing off  the CHG Soap.             9.  Pat yourself dry with a clean towel.            10.  Wear clean pajamas.            11.  Place clean sheets on your bed the night of your first shower and do not  sleep with pets. Day of Surgery : Do not apply any lotions/deodorants the morning of surgery.  Please wear clean clothes to the hospital/surgery center.  FAILURE TO FOLLOW THESE INSTRUCTIONS MAY RESULT IN THE CANCELLATION OF YOUR SURGERY PATIENT SIGNATURE_________________________________  NURSE SIGNATURE__________________________________  ________________________________________________________________________

## 2019-12-10 ENCOUNTER — Encounter (HOSPITAL_COMMUNITY)
Admission: RE | Admit: 2019-12-10 | Discharge: 2019-12-10 | Disposition: A | Discharge status: Home / Self Care | Payer: 59 | Source: Ambulatory Visit | Attending: Orthopaedic Surgery | Admitting: Orthopaedic Surgery

## 2019-12-10 ENCOUNTER — Other Ambulatory Visit: Payer: Self-pay

## 2019-12-10 ENCOUNTER — Encounter (HOSPITAL_COMMUNITY): Payer: Self-pay

## 2019-12-10 DIAGNOSIS — E119 Type 2 diabetes mellitus without complications: Secondary | ICD-10-CM | POA: Insufficient documentation

## 2019-12-10 DIAGNOSIS — R9431 Abnormal electrocardiogram [ECG] [EKG]: Secondary | ICD-10-CM | POA: Insufficient documentation

## 2019-12-10 DIAGNOSIS — Z01818 Encounter for other preprocedural examination: Secondary | ICD-10-CM | POA: Diagnosis not present

## 2019-12-10 HISTORY — DX: Depression, unspecified: F32.A

## 2019-12-10 HISTORY — DX: Type 2 diabetes mellitus without complications: E11.9

## 2019-12-10 HISTORY — DX: Anxiety disorder, unspecified: F41.9

## 2019-12-10 LAB — BASIC METABOLIC PANEL
Anion gap: 6 (ref 5–15)
BUN: 10 mg/dL (ref 6–20)
CO2: 31 mmol/L (ref 22–32)
Calcium: 8.4 mg/dL — ABNORMAL LOW (ref 8.9–10.3)
Chloride: 105 mmol/L (ref 98–111)
Creatinine, Ser: 0.77 mg/dL (ref 0.61–1.24)
GFR calc Af Amer: >60 mL/min (ref >60)
GFR calc non Af Amer: >60 mL/min (ref >60)
Glucose, Bld: 123 mg/dL — ABNORMAL HIGH (ref 70–99)
Potassium: 4.3 mmol/L (ref 3.5–5.1)
Sodium: 142 mmol/L (ref 135–145)

## 2019-12-10 LAB — CBC
HCT: 48 % (ref 39.0–52.0)
Hemoglobin: 15.1 g/dL (ref 13.0–17.0)
MCH: 28.4 pg (ref 26.0–34.0)
MCHC: 31.5 g/dL (ref 30.0–36.0)
MCV: 90.2 fL (ref 80.0–100.0)
Platelets: 262 10*3/uL (ref 150–400)
RBC: 5.32 MIL/uL (ref 4.22–5.81)
RDW: 13.6 % (ref 11.5–15.5)
WBC: 9.9 10*3/uL (ref 4.0–10.5)
nRBC: 0 % (ref 0.0–0.2)

## 2019-12-10 LAB — GLUCOSE, CAPILLARY: Glucose-Capillary: 124 mg/dL — ABNORMAL HIGH (ref 70–99)

## 2019-12-10 LAB — HEMOGLOBIN A1C
Hgb A1c MFr Bld: 7 % — ABNORMAL HIGH (ref 4.8–5.6)
Mean Plasma Glucose: 154.2 mg/dL

## 2019-12-10 NOTE — Progress Notes (Signed)
COVID Vaccine Completed:No Date COVID Vaccine completed: COVID vaccine manufacturer: [employer]   PCP - [facility] [place] Cardiologist - no  Chest x-ray - no EKG - 12/10/19 Stress Test - no ECHO - no Cardiac Cath - no  Sleep Study - yes CPAP - yes  Fasting Blood Sugar - 150s Checks Blood Sugar _____ times a day  Rare. He takes Ozempic once a week  Blood Thinner Instructions:ASA Aspirin Instructions:stop 7 days prior to [employer] Last Dose:6/2  Anesthesia review:   Patient denies shortness of breath, fever, cough and chest pain at PAT appointment yes  Patient verbalized understanding of instructions that were given to them at the PAT appointment. Patient was also instructed that they will need to review over the PAT instructions again at home before surgery. Yes

## 2019-12-13 ENCOUNTER — Other Ambulatory Visit (HOSPITAL_COMMUNITY)
Admission: RE | Admit: 2019-12-13 | Discharge: 2019-12-13 | Disposition: A | Discharge status: Home / Self Care | Payer: 59 | Source: Ambulatory Visit | Attending: Orthopaedic Surgery | Admitting: Orthopaedic Surgery

## 2019-12-13 DIAGNOSIS — Z01812 Encounter for preprocedural laboratory examination: Secondary | ICD-10-CM | POA: Diagnosis not present

## 2019-12-13 DIAGNOSIS — Z20822 Contact with and (suspected) exposure to covid-19: Secondary | ICD-10-CM | POA: Insufficient documentation

## 2019-12-13 LAB — SARS CORONAVIRUS 2 (TAT 6-24 HRS): SARS Coronavirus 2: NEGATIVE

## 2019-12-16 MED ORDER — DEXTROSE 5 % IV SOLN
3.0000 g | INTRAVENOUS | Status: AC
  Administered 2019-12-17: 3 g via INTRAVENOUS
  Filled 2019-12-16: qty 3

## 2019-12-16 NOTE — Anesthesia Preprocedure Evaluation (Addendum)
Anesthesia Evaluation  Patient identified by MRN, date of birth, ID band Patient awake    Reviewed: Allergy & Precautions, NPO status , Patient's Chart, lab work & pertinent test results  Airway Mallampati: III  TM Distance: >3 FB Neck ROM: Full    Dental no notable dental hx. (+) Teeth Intact, Dental Advisory Given   Pulmonary sleep apnea and Continuous Positive Airway Pressure Ventilation , Current Smoker and Patient abstained from smoking.,    Pulmonary exam normal breath sounds clear to auscultation       Cardiovascular hypertension, Pt. on medications Normal cardiovascular exam Rhythm:Regular Rate:Normal     Neuro/Psych PSYCHIATRIC DISORDERS Anxiety Depression    GI/Hepatic negative GI ROS, Neg liver ROS,   Endo/Other  diabetes, Type obesity  Renal/GU negative Renal ROS     Musculoskeletal negative musculoskeletal ROS (+)   Abdominal   Peds  Hematology   Anesthesia Other Findings   Reproductive/Obstetrics                            Anesthesia Physical Anesthesia Plan  ASA: III  Anesthesia Plan: General   Post-op Pain Management: GA combined w/ Regional for post-op pain   Induction: Intravenous  PONV Risk Score and Plan: 2 and Treatment may vary due to age or medical condition, Ondansetron, Dexamethasone and Midazolam  Airway Management Planned: Oral ETT and Video Laryngoscope Planned  Additional Equipment: None  Intra-op Plan:   Post-operative Plan: Extubation in OR  Informed Consent: I have reviewed the patients History and Physical, chart, labs and discussed the procedure including the risks, benefits and alternatives for the proposed anesthesia with the patient or authorized representative who has indicated his/her understanding and acceptance.     Dental advisory given  Plan Discussed with: CRNA  Anesthesia Plan Comments: (GA w L ISB)       Anesthesia  Quick Evaluation

## 2019-12-16 NOTE — H&P (Signed)
  PREOPERATIVE H&P  Chief Complaint: LEFT SHOULDER CARTILEDGE DISORDER, OSTEOARTHRITIS,IMPINGMENT SYNDROME  HPI: Justin Richardson is a 38 y.o. male who is scheduled for LEFT SHOULDER ARTHROSCOPY WITH SUBACROMIAL DECOMPRESSION AND DISTAL CLAVICLE EXCISION.   Patient has a past medical history significant for HTN, Diabetes, and sleep apnea.   The patient is a 38-year-old male who had a previous right shoulder arthroscopic biceps tenotomy, subacromial decompression, distal clavicle excision and he did reasonable with this. His right shoulder scope was in February 2020 by Dr. Varkey.  He is currently interested in his left shoulder.  He is having similar pain.  He has tried oral anti-inflammatories for more than six weeks.  He has tried a similar home exercise program as he did on the contralateral side.    His symptoms are rated as moderate to severe, and have been worsening.  This is significantly impairing activities of daily living.    Please see clinic note for further details on this patient's care.    He has elected for surgical management.   Past Medical History:  Diagnosis Date  . Anxiety   . Depression   . Diabetes mellitus without complication (HCC)   . Hypertension   . Sleep apnea    Past Surgical History:  Procedure Laterality Date  . CARPAL TUNNEL RELEASE Bilateral   . CHOLECYSTECTOMY    . HEEL SPUR SURGERY Left   . HERNIA REPAIR  2015  . SHOULDER ARTHROSCOPY Right 08/2018  . WRIST TENOLYSIS Right    Social History   Socioeconomic History  . Marital status: Married    Spouse name: Not on file  . Number of children: Not on file  . Years of education: Not on file  . Highest education level: Not on file  Occupational History  . Not on file  Tobacco Use  . Smoking status: Current Every Day Smoker    Packs/day: 1.00    Years: 15.00    Pack years: 15.00    Types: Cigarettes  . Smokeless tobacco: Former User    Quit date: 12/10/1999  Vaping Use  . Vaping Use:  Former  Substance and Sexual Activity  . Alcohol use: Yes    Comment: occasional  . Drug use: No  . Sexual activity: Not on file  Other Topics Concern  . Not on file  Social History Narrative  . Not on file   Social Determinants of Health   Financial Resource Strain:   . Difficulty of Paying Living Expenses:   Food Insecurity:   . Worried About Running Out of Food in the Last Year:   . Ran Out of Food in the Last Year:   Transportation Needs:   . Lack of Transportation (Medical):   . Lack of Transportation (Non-Medical):   Physical Activity:   . Days of Exercise per Week:   . Minutes of Exercise per Session:   Stress:   . Feeling of Stress :   Social Connections:   . Frequency of Communication with Friends and Family:   . Frequency of Social Gatherings with Friends and Family:   . Attends Religious Services:   . Active Member of Clubs or Organizations:   . Attends Club or Organization Meetings:   . Marital Status:    Family History  Problem Relation Age of Onset  . Hypertension Mother   . Hypertension Father   . Diabetes Father    Allergies  Allergen Reactions  . Beef-Derived Products Anaphylaxis  . Doxycycline       Headache    Prior to Admission medications   Medication Sig Start Date End Date Taking? Authorizing Provider  aspirin EC 81 MG tablet Take 81 mg by mouth daily.   Yes [provider]  FLUoxetine (PROZAC) 10 MG capsule Take 10 mg by mouth daily.   Yes [provider]  FLUoxetine (PROZAC) 40 MG capsule Take 40 mg by mouth daily.   Yes [provider]  gabapentin (NEURONTIN) 600 MG tablet Take 600 mg by mouth 3 (three) times daily.   Yes [provider]  lisinopril (PRINIVIL,ZESTRIL) 40 MG tablet Take 40 mg by mouth daily.   Yes [provider]  OZEMPIC, 0.25 OR 0.5 MG/DOSE, 2 MG/1.5ML SOPN Inject 0.5 mg into the skin once a week. 11/24/19  Yes [provider]  Testosterone Cypionate 200 MG/ML KIT  Inject 200 mg into the muscle every 14 (fourteen) days.    Yes [provider]    ROS: All other systems have been reviewed and were otherwise negative with the exception of those mentioned in the HPI and as above.  Physical Exam: General: Alert, no acute distress Cardiovascular: No pedal edema Respiratory: No cyanosis, no use of accessory musculature GI: No organomegaly, abdomen is soft and non-tender Skin: No lesions in the area of chief complaint Neurologic: Sensation intact distally Psychiatric: Patient is competent for consent with normal mood and affect Lymphatic: No axillary or cervical lymphadenopathy  MUSCULOSKELETAL:  Left shoulder: Positive impingement, AC tenderness to palpation and O'Brien's test on the left shoulder.  Full range of motion.  Cuff strength is intact.    Imaging: Three views of the shoulder demonstrate no acute osseous abnormalities.    Assessment: LEFT SHOULDER CARTILEDGE DISORDER, OSTEOARTHRITIS,IMPINGMENT SYNDROME  Plan: Plan for Procedure(s): LEFT SHOULDER ARTHROSCOPY WITH SUBACROMIAL DECOMPRESSION AND DISTAL CLAVICLE EXCISION  The patient seems to have very similar pathology to his right shoulder.  We talked about risks, benefits and alternatives of continued nonoperative measures versus surgery.  We talked about the risks, benefits and alternatives of an arthroscopic subacromial decompression, biceps tenotomy and distal clavicle excision.    The risks benefits and alternatives were discussed with the patient including but not limited to the risks of nonoperative treatment, versus surgical intervention including infection, bleeding, nerve injury,  blood clots, cardiopulmonary complications, morbidity, mortality, among others, and they were willing to proceed.   The patient acknowledged the explanation, agreed to proceed with the plan and consent was signed.   Operative Plan: Left shoulder scope with SAD, DCE, Biceps tenotomy Discharge  Medications: Standard DVT Prophylaxis: None Physical Therapy: Outpatient PT Special Discharge needs: Sling   Caroline N McBane, PA-C  12/16/2019 6:49 AM  

## 2019-12-17 ENCOUNTER — Ambulatory Visit (HOSPITAL_COMMUNITY): Payer: 59 | Admitting: Anesthesiology

## 2019-12-17 ENCOUNTER — Observation Stay (HOSPITAL_COMMUNITY)
Admission: RE | Admit: 2019-12-17 | Discharge: 2019-12-18 | Disposition: A | Discharge status: Home / Self Care | Payer: 59 | Attending: Orthopaedic Surgery | Admitting: Orthopaedic Surgery

## 2019-12-17 ENCOUNTER — Encounter (HOSPITAL_COMMUNITY)
Admission: RE | Disposition: A | Discharge status: Home / Self Care | Payer: Self-pay | Source: Home / Self Care | Attending: Orthopaedic Surgery

## 2019-12-17 ENCOUNTER — Other Ambulatory Visit: Payer: Self-pay

## 2019-12-17 ENCOUNTER — Encounter (HOSPITAL_COMMUNITY): Payer: Self-pay | Admitting: Orthopaedic Surgery

## 2019-12-17 DIAGNOSIS — I10 Essential (primary) hypertension: Secondary | ICD-10-CM | POA: Insufficient documentation

## 2019-12-17 DIAGNOSIS — Z79890 Hormone replacement therapy (postmenopausal): Secondary | ICD-10-CM | POA: Insufficient documentation

## 2019-12-17 DIAGNOSIS — Z79899 Other long term (current) drug therapy: Secondary | ICD-10-CM | POA: Insufficient documentation

## 2019-12-17 DIAGNOSIS — Z7982 Long term (current) use of aspirin: Secondary | ICD-10-CM | POA: Insufficient documentation

## 2019-12-17 DIAGNOSIS — M19012 Primary osteoarthritis, left shoulder: Secondary | ICD-10-CM | POA: Insufficient documentation

## 2019-12-17 DIAGNOSIS — S43432A Superior glenoid labrum lesion of left shoulder, initial encounter: Secondary | ICD-10-CM | POA: Diagnosis not present

## 2019-12-17 DIAGNOSIS — F419 Anxiety disorder, unspecified: Secondary | ICD-10-CM | POA: Insufficient documentation

## 2019-12-17 DIAGNOSIS — Z881 Allergy status to other antibiotic agents status: Secondary | ICD-10-CM | POA: Diagnosis not present

## 2019-12-17 DIAGNOSIS — X58XXXA Exposure to other specified factors, initial encounter: Secondary | ICD-10-CM | POA: Insufficient documentation

## 2019-12-17 DIAGNOSIS — E119 Type 2 diabetes mellitus without complications: Secondary | ICD-10-CM | POA: Diagnosis not present

## 2019-12-17 DIAGNOSIS — G4730 Sleep apnea, unspecified: Secondary | ICD-10-CM | POA: Diagnosis not present

## 2019-12-17 DIAGNOSIS — M7542 Impingement syndrome of left shoulder: Secondary | ICD-10-CM | POA: Diagnosis present

## 2019-12-17 DIAGNOSIS — Z6841 Body Mass Index (BMI) 40.0 and over, adult: Secondary | ICD-10-CM | POA: Diagnosis not present

## 2019-12-17 DIAGNOSIS — F329 Major depressive disorder, single episode, unspecified: Secondary | ICD-10-CM | POA: Diagnosis not present

## 2019-12-17 DIAGNOSIS — Z9889 Other specified postprocedural states: Secondary | ICD-10-CM

## 2019-12-17 DIAGNOSIS — F17210 Nicotine dependence, cigarettes, uncomplicated: Secondary | ICD-10-CM | POA: Diagnosis not present

## 2019-12-17 LAB — GLUCOSE, CAPILLARY
Glucose-Capillary: 132 mg/dL — ABNORMAL HIGH (ref 70–99)
Glucose-Capillary: 136 mg/dL — ABNORMAL HIGH (ref 70–99)

## 2019-12-17 SURGERY — SHOULDER ARTHROSCOPY WITH SUBACROMIAL DECOMPRESSION AND DISTAL CLAVICLE EXCISION
Anesthesia: General | Laterality: Left

## 2019-12-17 MED ORDER — OXYCODONE HCL 5 MG PO TABS: ORAL_TABLET | ORAL | 0 refills | Status: AC

## 2019-12-17 MED ORDER — SUGAMMADEX SODIUM 200 MG/2ML IV SOLN
INTRAVENOUS | Status: DC | PRN
  Administered 2019-12-17: 400 mg via INTRAVENOUS

## 2019-12-17 MED ORDER — EPINEPHRINE PF 1 MG/ML IJ SOLN
INTRAMUSCULAR | Status: AC
  Filled 2019-12-17: qty 1

## 2019-12-17 MED ORDER — ACETAMINOPHEN 10 MG/ML IV SOLN: 1000.0000 mg | Freq: Once | INTRAVENOUS | Status: DC | PRN

## 2019-12-17 MED ORDER — KETAMINE HCL 10 MG/ML IJ SOLN
INTRAMUSCULAR | Status: AC
  Filled 2019-12-17: qty 1

## 2019-12-17 MED ORDER — BISACODYL 10 MG RE SUPP: 10.0000 mg | Freq: Every day | RECTAL | Status: DC | PRN

## 2019-12-17 MED ORDER — LIDOCAINE 2% (20 MG/ML) 5 ML SYRINGE
INTRAMUSCULAR | Status: DC | PRN
  Administered 2019-12-17: 100 mg via INTRAVENOUS

## 2019-12-17 MED ORDER — HYDROMORPHONE HCL 1 MG/ML IJ SOLN: 0.5000 mg | INTRAMUSCULAR | Status: DC | PRN

## 2019-12-17 MED ORDER — ONDANSETRON HCL 4 MG PO TABS: 4.0000 mg | ORAL_TABLET | Freq: Three times a day (TID) | ORAL | 1 refills | Status: AC | PRN

## 2019-12-17 MED ORDER — SENNA 8.6 MG PO TABS
1.0000 | ORAL_TABLET | Freq: Two times a day (BID) | ORAL | Status: DC
  Administered 2019-12-17 – 2019-12-18 (×2): 8.6 mg via ORAL
  Filled 2019-12-17 (×2): qty 1

## 2019-12-17 MED ORDER — METOCLOPRAMIDE HCL 5 MG/ML IJ SOLN: 5.0000 mg | Freq: Three times a day (TID) | INTRAMUSCULAR | Status: DC | PRN

## 2019-12-17 MED ORDER — ONDANSETRON HCL 4 MG PO TABS: 4.0000 mg | ORAL_TABLET | Freq: Four times a day (QID) | ORAL | Status: DC | PRN

## 2019-12-17 MED ORDER — FLUOXETINE HCL 10 MG PO CAPS
10.0000 mg | ORAL_CAPSULE | Freq: Every day | ORAL | Status: DC
  Administered 2019-12-18: 10 mg via ORAL
  Filled 2019-12-17: qty 1

## 2019-12-17 MED ORDER — FENTANYL CITRATE (PF) 250 MCG/5ML IJ SOLN
INTRAMUSCULAR | Status: DC | PRN
  Administered 2019-12-17: 100 ug via INTRAVENOUS

## 2019-12-17 MED ORDER — SUCCINYLCHOLINE CHLORIDE 200 MG/10ML IV SOSY
PREFILLED_SYRINGE | INTRAVENOUS | Status: DC | PRN
Start: 2019-12-17 — End: 2019-12-17
  Administered 2019-12-17: 160 mg via INTRAVENOUS

## 2019-12-17 MED ORDER — ZOLPIDEM TARTRATE 5 MG PO TABS: 5.0000 mg | ORAL_TABLET | Freq: Every evening | ORAL | Status: DC | PRN

## 2019-12-17 MED ORDER — OXYCODONE HCL 5 MG PO TABS: 5.0000 mg | ORAL_TABLET | ORAL | Status: DC | PRN

## 2019-12-17 MED ORDER — MIDAZOLAM HCL 2 MG/2ML IJ SOLN
INTRAMUSCULAR | Status: AC
  Filled 2019-12-17: qty 2

## 2019-12-17 MED ORDER — ACETAMINOPHEN 500 MG PO TABS
1000.0000 mg | ORAL_TABLET | Freq: Three times a day (TID) | ORAL | Status: DC
  Administered 2019-12-17 – 2019-12-18 (×3): 1000 mg via ORAL
  Filled 2019-12-17 (×3): qty 2

## 2019-12-17 MED ORDER — PHENOL 1.4 % MT LIQD: 1.0000 | OROMUCOSAL | Status: DC | PRN

## 2019-12-17 MED ORDER — ROCURONIUM BROMIDE 10 MG/ML (PF) SYRINGE
PREFILLED_SYRINGE | INTRAVENOUS | Status: DC | PRN
  Administered 2019-12-17: 50 mg via INTRAVENOUS

## 2019-12-17 MED ORDER — SODIUM CHLORIDE 0.9 % IR SOLN
Status: DC | PRN
  Administered 2019-12-17: 6000 mL

## 2019-12-17 MED ORDER — DEXAMETHASONE SODIUM PHOSPHATE 4 MG/ML IJ SOLN
INTRAMUSCULAR | Status: DC | PRN
Start: 2019-12-17 — End: 2019-12-17
  Administered 2019-12-17: 4 mg via INTRAVENOUS

## 2019-12-17 MED ORDER — MIDAZOLAM HCL 2 MG/2ML IJ SOLN
1.0000 mg | INTRAMUSCULAR | Status: AC
  Administered 2019-12-17: 2 mg via INTRAVENOUS
  Filled 2019-12-17: qty 2

## 2019-12-17 MED ORDER — FENTANYL CITRATE (PF) 100 MCG/2ML IJ SOLN
50.0000 ug | INTRAMUSCULAR | Status: AC
  Administered 2019-12-17: 100 ug via INTRAVENOUS
  Filled 2019-12-17: qty 2

## 2019-12-17 MED ORDER — PROPOFOL 10 MG/ML IV BOLUS
INTRAVENOUS | Status: DC | PRN
  Administered 2019-12-17: 50 mg via INTRAVENOUS
  Administered 2019-12-17: 300 mg via INTRAVENOUS

## 2019-12-17 MED ORDER — EPINEPHRINE PF 1 MG/ML IJ SOLN
INTRAMUSCULAR | Status: DC | PRN
  Administered 2019-12-17: 2 mg

## 2019-12-17 MED ORDER — KETAMINE HCL 10 MG/ML IJ SOLN
INTRAMUSCULAR | Status: DC | PRN
Start: 2019-12-17 — End: 2019-12-17
  Administered 2019-12-17: 30 mg via INTRAVENOUS

## 2019-12-17 MED ORDER — METHOCARBAMOL 500 MG PO TABS
500.0000 mg | ORAL_TABLET | Freq: Four times a day (QID) | ORAL | Status: DC | PRN
  Administered 2019-12-17: 500 mg via ORAL
  Filled 2019-12-17: qty 1

## 2019-12-17 MED ORDER — GABAPENTIN 300 MG PO CAPS
600.0000 mg | ORAL_CAPSULE | Freq: Three times a day (TID) | ORAL | Status: DC
  Administered 2019-12-17 – 2019-12-18 (×2): 600 mg via ORAL
  Filled 2019-12-17 (×2): qty 2

## 2019-12-17 MED ORDER — ONDANSETRON HCL 4 MG/2ML IJ SOLN: 4.0000 mg | Freq: Four times a day (QID) | INTRAMUSCULAR | Status: DC | PRN

## 2019-12-17 MED ORDER — ORAL CARE MOUTH RINSE: 15.0000 mL | Freq: Once | OROMUCOSAL | Status: AC

## 2019-12-17 MED ORDER — MAGNESIUM CITRATE PO SOLN: 1.0000 | Freq: Once | ORAL | Status: DC | PRN

## 2019-12-17 MED ORDER — PROPOFOL 10 MG/ML IV BOLUS
INTRAVENOUS | Status: AC
  Filled 2019-12-17: qty 40

## 2019-12-17 MED ORDER — 0.9 % SODIUM CHLORIDE (POUR BTL) OPTIME
TOPICAL | Status: DC | PRN
  Administered 2019-12-17: 1000 mL

## 2019-12-17 MED ORDER — POLYETHYLENE GLYCOL 3350 17 G PO PACK: 17.0000 g | PACK | Freq: Every day | ORAL | Status: DC | PRN

## 2019-12-17 MED ORDER — LACTATED RINGERS IV SOLN: INTRAVENOUS | Status: DC

## 2019-12-17 MED ORDER — FLUOXETINE HCL 20 MG PO CAPS
40.0000 mg | ORAL_CAPSULE | Freq: Every day | ORAL | Status: DC
  Administered 2019-12-18: 40 mg via ORAL
  Filled 2019-12-17: qty 2

## 2019-12-17 MED ORDER — FENTANYL CITRATE (PF) 100 MCG/2ML IJ SOLN
INTRAMUSCULAR | Status: AC
  Filled 2019-12-17: qty 2

## 2019-12-17 MED ORDER — DEXAMETHASONE SODIUM PHOSPHATE 10 MG/ML IJ SOLN
INTRAMUSCULAR | Status: AC
  Filled 2019-12-17: qty 1

## 2019-12-17 MED ORDER — MENTHOL 3 MG MT LOZG: 1.0000 | LOZENGE | OROMUCOSAL | Status: DC | PRN

## 2019-12-17 MED ORDER — HYDROCODONE-ACETAMINOPHEN 7.5-325 MG PO TABS: 1.0000 | ORAL_TABLET | Freq: Once | ORAL | Status: DC | PRN

## 2019-12-17 MED ORDER — HYDROMORPHONE HCL 1 MG/ML IJ SOLN: 0.2500 mg | INTRAMUSCULAR | Status: DC | PRN

## 2019-12-17 MED ORDER — ONDANSETRON HCL 4 MG/2ML IJ SOLN: 4.0000 mg | Freq: Once | INTRAMUSCULAR | Status: DC | PRN

## 2019-12-17 MED ORDER — MELOXICAM 7.5 MG PO TABS
7.5000 mg | ORAL_TABLET | Freq: Every day | ORAL | 0 refills | Status: AC
Start: 2019-12-17 — End: 2020-01-16

## 2019-12-17 MED ORDER — DIPHENHYDRAMINE HCL 12.5 MG/5ML PO ELIX: 12.5000 mg | ORAL_SOLUTION | ORAL | Status: DC | PRN

## 2019-12-17 MED ORDER — CHLORHEXIDINE GLUCONATE 0.12 % MT SOLN
15.0000 mL | Freq: Once | OROMUCOSAL | Status: AC
  Administered 2019-12-17: 15 mL via OROMUCOSAL

## 2019-12-17 MED ORDER — LISINOPRIL 20 MG PO TABS
40.0000 mg | ORAL_TABLET | Freq: Every day | ORAL | Status: DC
  Administered 2019-12-17 – 2019-12-18 (×2): 40 mg via ORAL
  Filled 2019-12-17 (×2): qty 2

## 2019-12-17 MED ORDER — CEFAZOLIN SODIUM-DEXTROSE 2-4 GM/100ML-% IV SOLN
2.0000 g | Freq: Four times a day (QID) | INTRAVENOUS | Status: AC
  Administered 2019-12-17 – 2019-12-18 (×3): 2 g via INTRAVENOUS
  Filled 2019-12-17 (×3): qty 100

## 2019-12-17 MED ORDER — ONDANSETRON HCL 4 MG/2ML IJ SOLN
INTRAMUSCULAR | Status: AC
  Filled 2019-12-17: qty 2

## 2019-12-17 MED ORDER — METOCLOPRAMIDE HCL 5 MG PO TABS: 5.0000 mg | ORAL_TABLET | Freq: Three times a day (TID) | ORAL | Status: DC | PRN

## 2019-12-17 MED ORDER — ACETAMINOPHEN 500 MG PO TABS
1000.0000 mg | ORAL_TABLET | Freq: Three times a day (TID) | ORAL | 0 refills | Status: AC
Start: 2019-12-17 — End: 2019-12-31

## 2019-12-17 MED ORDER — CELECOXIB 200 MG PO CAPS
200.0000 mg | ORAL_CAPSULE | Freq: Two times a day (BID) | ORAL | Status: DC
  Administered 2019-12-17 – 2019-12-18 (×2): 200 mg via ORAL
  Filled 2019-12-17 (×2): qty 1

## 2019-12-17 MED ORDER — BUPIVACAINE LIPOSOME 1.3 % IJ SUSP
INTRAMUSCULAR | Status: DC | PRN
  Administered 2019-12-17: 10 mL via PERINEURAL

## 2019-12-17 MED ORDER — METHOCARBAMOL 1000 MG/10ML IJ SOLN
500.0000 mg | Freq: Four times a day (QID) | INTRAVENOUS | Status: DC | PRN
  Filled 2019-12-17: qty 5

## 2019-12-17 MED ORDER — MEPERIDINE HCL 50 MG/ML IJ SOLN: 6.2500 mg | INTRAMUSCULAR | Status: DC | PRN

## 2019-12-17 MED ORDER — LIDOCAINE 2% (20 MG/ML) 5 ML SYRINGE
INTRAMUSCULAR | Status: AC
  Filled 2019-12-17: qty 5

## 2019-12-17 SURGICAL SUPPLY — 75 items
AID PSTN UNV HD RSTRNT DISP (MISCELLANEOUS) ×1
APL PRP STRL LF DISP 70% ISPRP (MISCELLANEOUS) ×1
BLADE EXCALIBUR 4.0MM X 13CM (MISCELLANEOUS) ×1
BLADE EXCALIBUR 4.0X13 (MISCELLANEOUS) ×2 IMPLANT
BLADE SURG 15 STRL LF DISP TIS (BLADE) IMPLANT
BLADE SURG 15 STRL SS (BLADE)
BURR OVAL 8 FLU 4.0MM X 13CM (MISCELLANEOUS) ×1
BURR OVAL 8 FLU 4.0X13 (MISCELLANEOUS) ×2 IMPLANT
BURR OVAL 8 FLU 5.0MM X 13CM (MISCELLANEOUS)
BURR OVAL 8 FLU 5.0X13 (MISCELLANEOUS) IMPLANT
CANNULA TWIST IN 8.25X7CM (CANNULA) IMPLANT
CHLORAPREP W/TINT 26 (MISCELLANEOUS) ×3 IMPLANT
CLOSURE STERI-STRIP 1/2X4 (GAUZE/BANDAGES/DRESSINGS) ×1
CLSR STERI-STRIP ANTIMIC 1/2X4 (GAUZE/BANDAGES/DRESSINGS) ×2 IMPLANT
COVER WAND RF STERILE (DRAPES) IMPLANT
DECANTER SPIKE VIAL GLASS SM (MISCELLANEOUS) IMPLANT
DISSECTOR  3.8MM X 13CM (MISCELLANEOUS) ×3
DISSECTOR 3.8MM X 13CM (MISCELLANEOUS) ×1 IMPLANT
DRAPE IMP U-DRAPE 54X76 (DRAPES) ×3 IMPLANT
DRAPE OEC MINIVIEW 54X84 (DRAPES) IMPLANT
DRAPE ORTHO SPLIT 77X108 STRL (DRAPES) ×6
DRAPE STERI 35X30 U-POUCH (DRAPES) ×3 IMPLANT
DRAPE SURG ORHT 6 SPLT 77X108 (DRAPES) ×2 IMPLANT
DRAPE U-SHAPE 47X51 STRL (DRAPES) ×3 IMPLANT
DRSG PAD ABDOMINAL 8X10 ST (GAUZE/BANDAGES/DRESSINGS) ×9 IMPLANT
ELECT MENISCUS 165MM 90D (ELECTRODE) ×3 IMPLANT
ELECT REM PT RETURN 15FT ADLT (MISCELLANEOUS) ×3 IMPLANT
FILTER STRAW (MISCELLANEOUS) ×3 IMPLANT
GAUZE SPONGE 4X4 12PLY STRL (GAUZE/BANDAGES/DRESSINGS) ×3 IMPLANT
GLOVE BIO SURGEON STRL SZ 6.5 (GLOVE) ×4 IMPLANT
GLOVE BIO SURGEONS STRL SZ 6.5 (GLOVE) ×2
GLOVE BIOGEL PI IND STRL 6.5 (GLOVE) ×1 IMPLANT
GLOVE BIOGEL PI IND STRL 8 (GLOVE) ×1 IMPLANT
GLOVE BIOGEL PI INDICATOR 6.5 (GLOVE) ×2
GLOVE BIOGEL PI INDICATOR 8 (GLOVE) ×2
GLOVE ECLIPSE 8.0 STRL XLNG CF (GLOVE) ×3 IMPLANT
GOWN STRL REUS W/ TWL LRG LVL3 (GOWN DISPOSABLE) ×2 IMPLANT
GOWN STRL REUS W/TWL LRG LVL3 (GOWN DISPOSABLE) ×6
IV NS IRRIG 3000ML ARTHROMATIC (IV SOLUTION) ×12 IMPLANT
KIT BASIN (CUSTOM PROCEDURE TRAY) ×3 IMPLANT
KIT STABILIZATION SHOULDER (MISCELLANEOUS) ×3 IMPLANT
MANIFOLD NEPTUNE II (INSTRUMENTS) ×3 IMPLANT
NDL SAFETY ECLIPSE 18X1.5 (NEEDLE) ×1 IMPLANT
NDL SCORPION MULTI FIRE (NEEDLE) IMPLANT
NDL SUT 6 .5 CRC .975X.05 MAYO (NEEDLE) IMPLANT
NEEDLE HYPO 18GX1.5 SHARP (NEEDLE) ×3
NEEDLE MAYO TAPER (NEEDLE)
NEEDLE SCORPION MULTI FIRE (NEEDLE) IMPLANT
PACK ARTHROSCOPY WL (CUSTOM PROCEDURE TRAY) ×3 IMPLANT
PASSER SUT SWANSON 36MM LOOP (INSTRUMENTS) IMPLANT
PORT APPOLLO RF 90DEGREE MULTI (SURGICAL WAND) ×3 IMPLANT
RESTRAINT HEAD UNIVERSAL NS (MISCELLANEOUS) ×3 IMPLANT
RETRIEVER SUT HEWSON (MISCELLANEOUS) IMPLANT
SLING ARM FOAM STRAP LRG (SOFTGOODS) IMPLANT
SLING ARM FOAM STRAP MED (SOFTGOODS) IMPLANT
SLING ARM FOAM STRAP SML (SOFTGOODS) IMPLANT
SLING ARM FOAM STRAP XLG (SOFTGOODS) ×2 IMPLANT
SLING ARM IMMOBILIZER MED (SOFTGOODS) IMPLANT
SPONGE LAP 4X18 RFD (DISPOSABLE) IMPLANT
SUCTION FRAZIER HANDLE 12FR (TUBING)
SUCTION TUBE FRAZIER 12FR DISP (TUBING) IMPLANT
SUT ETHIBOND 2 OS 4 DA (SUTURE) IMPLANT
SUT ETHILON 3 0 PS 1 (SUTURE) IMPLANT
SUT FIBERWIRE #2 38 T-5 BLUE (SUTURE)
SUT MNCRL AB 4-0 PS2 18 (SUTURE) ×3 IMPLANT
SUT TIGER TAPE 7 IN WHITE (SUTURE) IMPLANT
SUT VIC AB 0 CT1 27 (SUTURE)
SUT VIC AB 0 CT1 27XBRD ANBCTR (SUTURE) IMPLANT
SUT VIC AB 3-0 FS2 27 (SUTURE) IMPLANT
SUTURE FIBERWR #2 38 T-5 BLUE (SUTURE) IMPLANT
TAPE FIBER 2MM 7IN #2 BLUE (SUTURE) IMPLANT
TOWEL OR 17X26 10 PK STRL BLUE (TOWEL DISPOSABLE) ×3 IMPLANT
TUBING ARTHROSCOPY IRRIG 16FT (MISCELLANEOUS) ×3 IMPLANT
WATER STERILE IRR 1000ML POUR (IV SOLUTION) ×3 IMPLANT
YANKAUER SUCT BULB TIP NO VENT (SUCTIONS) IMPLANT

## 2019-12-17 NOTE — Anesthesia Procedure Notes (Signed)
Procedure Name: Intubation Date/Time: 12/17/2019 10:13 AM Performed by: Lucinda Dell, CRNA Pre-anesthesia Checklist: Patient identified, Emergency Drugs available, Suction available and Patient being monitored Patient Re-evaluated:Patient Re-evaluated prior to induction Oxygen Delivery Method: Circle system utilized Preoxygenation: Pre-oxygenation with 100% oxygen Induction Type: IV induction Ventilation: Mask ventilation without difficulty Laryngoscope Size: Glidescope and 4 Tube type: Oral Tube size: 7.5 mm Number of attempts: 1 Airway Equipment and Method: Stylet and Video-laryngoscopy Placement Confirmation: positive ETCO2,  breath sounds checked- equal and bilateral and ETT inserted through vocal cords under direct vision Secured at: 23 cm Tube secured with: Tape Dental Injury: Teeth and Oropharynx as per pre-operative assessment

## 2019-12-17 NOTE — Op Note (Signed)
Orthopaedic Surgery Operative Note (CSN: [id number])  Justin Richardson  05-08-1981 Date of Surgery: 12/17/2019   Diagnoses:  Left shoulder impingement, AC arthrosis and SLAP tear  Procedure: Arthroscopic extensive debridement Arthroscopic subacromial decompression Arthroscopic distal clavicle excision   Operative Finding Exam under anesthesia: Full motion no limitation Articular space: No loose bodies, capsule intact, anterior labrum intact however there is significant fraying and a type II SLAP tear of the superior labrum.  Tenotomy performed. Chondral surfaces:Intact, no sign of chondral degeneration on the glenoid or humeral head Biceps: Type II SLAP tear and tenotomy performed.  Debrided back to a stable base the stump. Subscapularis: Normal Superior Cuff: Normal Bursal side: Extensive inflammation and bursitis however the cuff is intact.  Successful completion of the planned procedure.  Patient did not have an MRI secondary to his clinical symptoms and cost in the setting of previous surgery in the contralateral side.  He did not fact have exactly what we had expected with a SLAP tear and a intact cuff.  His AC arthrosis and impingement were clinical diagnoses.  Surgery was routine.  The anesthesiologist had concerns due to his BMI of 50 and his use of CPAP preoperatively that he may be difficult to extubate.  They will continue to monitor him.  We may keep him overnight if needed for monitoring.   Post-operative plan: The patient will be non-weightbearing in a sling for 1 week.  The patient will be discharged home if stable per anesthesia.  DVT prophylaxis not indicated in ambulatory upper extremity patient without known risk factors.   Pain control with PRN pain medication preferring oral medicines.  Follow up plan will be scheduled in approximately 7 days for incision check and XR.  Post-Op Diagnosis: Same Surgeons:Primary: Bjorn Pippin, MD Assistants:Caroline McBane  PA-C Location: [facility] ROOM 08 Anesthesia: General with Exparel interscalene block Antibiotics: Ancef 3 g Tourniquet time: None Estimated Blood Loss: Minimal Complications: None Specimens: None Implants: * No implants in log *  Indications for Surgery:   [name] is a 39 y.o. male with continued shoulder pain refractory to nonoperative measures for extended period of time.   He had had similar symptoms to contralateral side and did well with a tenotomy, decompression and distal clavicle excision.  Based on this and his clinical findings he wanted to save the money that would have been spent on MRI and we felt that it was reasonable to go forward without an MRI.  He had no cuff findings on his preoperative clinical exam.  The risks and benefits were explained at length including but not limited to continued pain, cuff failure, biceps tenodesis failure, stiffness, need for further surgery and infection.   Procedure:   Patient was correctly identified in the preoperative holding area and operative site marked.  Patient brought to OR and positioned beachchair on an Vega table ensuring that all bony prominences were padded and the head was in an appropriate location.  Anesthesia was induced and the operative shoulder was prepped and draped in the usual sterile fashion.  Timeout was called preincision.  A standard posterior viewing portal was made after localizing the portal with a spinal needle.  An anterior accessory portal was also made.  After clearing the articular space the camera was positioned in the subacromial space.  Findings above.    Extensive debridement was performed of the anterior interval tissue, labral fraying and the bursa.  We performed a biceps tenotomy and debrided the stump back to a  stable base.  Subacromial decompression: We made a lateral portal with spinal needle guidance. We then proceeded to debride bursal tissue extensively with a shaver and arthrocare device. At  that point we continued to identify the borders of the acromion and identify the spur. We then carefully preserved the deltoid fascia and used a burr to convert the acromion to a Type 1 flat acromion without issue.  Distal Clavicle resection:  The scope was placed in the subacromial space from the posterior portal.  A hemostat was placed through the anterior portal and we spread at the [facility] joint.  A burr was then inserted and 10 mm of distal clavicle was resected taking care to avoid damage to the capsule around the joint and avoiding overhanging bone posteriorly.    The incisions were closed with absorbable monocryl and steri strips.  A sterile dressing was placed along with a sling. The patient was awoken from general anesthesia and taken to the PACU in stable condition without complication.   Noemi Chapel, PA-C, present and scrubbed throughout the case, critical for completion in a timely fashion, and for retraction, instrumentation, closure.

## 2019-12-17 NOTE — Anesthesia Postprocedure Evaluation (Signed)
Anesthesia Post Note  Patient: Justin Richardson  Procedure(s) Performed: LEFT SHOULDER ARTHROSCOPY WITH SUBACROMIAL DECOMPRESSION AND DISTAL CLAVICLE EXCISION (Left )     Patient location during evaluation: PACU Anesthesia Type: General Level of consciousness: awake and alert Pain management: pain level controlled Vital Signs Assessment: post-procedure vital signs reviewed and stable Respiratory status: spontaneous breathing, nonlabored ventilation, respiratory function stable and patient connected to face mask oxygen Cardiovascular status: blood pressure returned to baseline and stable Postop Assessment: no apparent nausea or vomiting Anesthetic complications: no Comments: Pt wearing own CPAP per plan if Rm air o2 = or greater than 89 then will Dc home    No complications documented.  Last Vitals:  Vitals:   12/17/19 1400 12/17/19 1415  BP: 125/87 (!) 153/88  Pulse: 80 82  Resp: 20 17  Temp:    SpO2: (!) 87% 90%    Last Pain:  Vitals:   12/17/19 1415  TempSrc:   PainSc: 0-No pain                 Trevor Iha

## 2019-12-17 NOTE — Interval H&P Note (Signed)
History and Physical Interval Note:  12/17/2019 8:21 AM  Justin Richardson  has presented today for surgery, with the diagnosis of LEFT SHOULDER CARTILEDGE DISORDER, OSTEOARTHRITIS,IMPINGMENT SYNDROME.  The various methods of treatment have been discussed with the patient and family. After consideration of risks, benefits and other options for treatment, the patient has consented to  Procedure(s): LEFT SHOULDER ARTHROSCOPY WITH SUBACROMIAL DECOMPRESSION AND DISTAL CLAVICLE EXCISION (Left) as a surgical intervention.  The patient's history has been reviewed, patient examined, no change in status, stable for surgery.  I have reviewed the patient's chart and labs.  Questions were answered to the patient's satisfaction.     Bjorn Pippin

## 2019-12-17 NOTE — Transfer of Care (Signed)
Immediate Anesthesia Transfer of Care Note  Patient: Justin Richardson  Procedure(s) Performed: LEFT SHOULDER ARTHROSCOPY WITH SUBACROMIAL DECOMPRESSION AND DISTAL CLAVICLE EXCISION (Left )  Patient Location: PACU  Anesthesia Type:GA combined with regional for post-op pain  Level of Consciousness: sedated and responds to stimulation  Airway & Oxygen Therapy: Patient Spontanous Breathing and Patient connected to face mask oxygen  Post-op Assessment: Report given to RN and Post -op Vital signs reviewed and stable  Post vital signs: Reviewed and stable  Last Vitals:  Vitals Value Taken Time  BP 162/86 12/17/19 1118  Temp    Pulse 89 12/17/19 1124  Resp 21 12/17/19 1124  SpO2 92 % 12/17/19 1124  Vitals shown include unvalidated device data.  Last Pain:  Vitals:   12/17/19 0737  TempSrc:   PainSc: 9       Patients Stated Pain Goal: 4 (12/17/19 0737)  Complications: No complications documented.

## 2019-12-17 NOTE — Plan of Care (Signed)

## 2019-12-17 NOTE — Anesthesia Procedure Notes (Signed)
Anesthesia Regional Block: Interscalene brachial plexus block   Pre-Anesthetic Checklist: ,, timeout performed, Correct Patient, Correct Site, Correct Laterality, Correct Procedure, Correct Position, site marked, Risks and benefits discussed,  Surgical consent,  Pre-op evaluation,  At surgeon's request and post-op pain management  Laterality: Upper and Left  Prep: Maximum Sterile Barrier Precautions used, chloraprep       Needles:  Injection technique: Single-shot  Needle Type: Echogenic Needle     Needle Length: 5cm  Needle Gauge: 21     Additional Needles:   Procedures:,,,, ultrasound used (permanent image in chart),,,,  Narrative:  Start time: 12/17/2019 9:05 AM End time: 12/17/2019 9:15 AM Injection made incrementally with aspirations every 5 mL.  Performed by: Personally  Anesthesiologist: Trevor Iha, MD  Additional Notes: Block assessed prior to procedure. Patient tolerated procedure well.

## 2019-12-17 NOTE — Progress Notes (Signed)
Assisted Dr. Druscilla Brownie with Left Interscalene brachial plexus block. Side rails up, monitors on throughout procedure. See vital signs in flow sheet. Tolerated Procedure well.

## 2019-12-18 DIAGNOSIS — M7542 Impingement syndrome of left shoulder: Secondary | ICD-10-CM | POA: Diagnosis not present

## 2019-12-18 NOTE — Plan of Care (Signed)
  Problem: Education: Goal: Knowledge of General Education information will improve Description: Including pain rating scale, medication(s)/side effects and non-pharmacologic comfort measures Outcome: Adequate for Discharge   Problem: Health Behavior/Discharge Planning: Goal: Ability to manage health-related needs will improve Outcome: Adequate for Discharge   Problem: Clinical Measurements: Goal: Ability to maintain clinical measurements within normal limits will improve Outcome: Adequate for Discharge Goal: Will remain free from infection Outcome: Adequate for Discharge Goal: Diagnostic test results will improve Outcome: Adequate for Discharge Goal: Respiratory complications will improve Outcome: Adequate for Discharge Goal: Cardiovascular complication will be avoided Outcome: Adequate for Discharge   Problem: Activity: Goal: Risk for activity intolerance will decrease Outcome: Adequate for Discharge   Problem: Nutrition: Goal: Adequate nutrition will be maintained Outcome: Adequate for Discharge   Problem: Coping: Goal: Level of anxiety will decrease Outcome: Adequate for Discharge   Problem: Elimination: Goal: Will not experience complications related to bowel motility Outcome: Adequate for Discharge Goal: Will not experience complications related to urinary retention Outcome: Adequate for Discharge   Problem: Pain Managment: Goal: General experience of comfort will improve Outcome: Adequate for Discharge   Problem: Safety: Goal: Ability to remain free from injury will improve Outcome: Adequate for Discharge   Problem: Skin Integrity: Goal: Risk for impaired skin integrity will decrease Outcome: Adequate for Discharge   Problem: Education: Goal: Knowledge of the prescribed therapeutic regimen will improve Outcome: Adequate for Discharge Goal: Understanding of activity limitations/precautions following surgery will improve Outcome: Adequate for  Discharge Goal: Individualized Educational Video(s) Outcome: Adequate for Discharge   Problem: Activity: Goal: Ability to tolerate increased activity will improve Outcome: Adequate for Discharge   Problem: Pain Management: Goal: Pain level will decrease with appropriate interventions Outcome: Adequate for Discharge   

## 2019-12-18 NOTE — Discharge Summary (Signed)
Patient ID: SHIHEEM CORPORAN MRN: [id number] DOB/AGE: [birth year] 39 y.o.  Admit date: 12/17/2019 Discharge date: 12/18/2019  Admission Diagnoses: Left shoulder impingement, AC arthrosis and SLAP tear  Discharge Diagnoses:  Left shoulder impingement, AC arthrosis and SLAP tear Sleep apnea  Active Problems:   Status post shoulder surgery   Past Medical History:  Diagnosis Date  . Anxiety   . Depression   . Diabetes mellitus without complication ([place])   . Hypertension   . Sleep apnea     Procedures Performed:  Arthroscopic extensive debridement Arthroscopic subacromial decompression Arthroscopic distal clavicle excision Arthroscopic biceps tenotomy   Discharged Condition: stable  Hospital Course: Patient brought in to [facility] for outpatient surgery.  He tolerated procedure well. Anesthesiology had concerns due to his BMI of 50 and use of CPAP preoperatively that he may be difficult to extubate. He had to be placed on nasal cannula prior to surgery. Recorded O2 sat of 89% prior to surgery. Once extubated oxygen levels remained around 87- 89%. He was kept overnight for monitoring of pain control and medical stabilization postop. He had no overnight events. He wore CPAP during the night as he does at home. He was weaned to room air this morning. O2 sats around 91-93%, which patient notes is his baseline. He had no trouble overnight, slept well, no chest pain, shortness of breath, difficulty breathing, cough, nausea, or vomiting. He was found to be stable for DC home the morning after surgery.  Patient was instructed on specific activity restrictions and all questions were answered.   Consults: None  Significant Diagnostic Studies: No additional pertinent studies  Treatments: Surgery  Discharge Exam: Left upper extremity: Bulky dressing CDI and sling well fitting. Full and painless ROM throughout hand with DPC of 0. + Motor in  AIN, PIN, Ulnar distributions. Axillary nerve  sensation preserved and symmetric.  Sensation intact in medial, radial, and ulnar distributions. Well perfused digits.   Disposition: Discharge disposition: 01-Home or Self Care     Follow-up: 1 week in office with Dr. Griffin Basil Post-op medications: Sent electronically to patient's pharmacy.    Discharge Instructions    Call MD for:  redness, tenderness, or signs of infection (pain, swelling, redness, odor or green/yellow discharge around incision site)   Complete by: As directed    Call MD for:  redness, tenderness, or signs of infection (pain, swelling, redness, odor or green/yellow discharge around incision site)   Complete by: As directed    Call MD for:  severe uncontrolled pain   Complete by: As directed    Call MD for:  severe uncontrolled pain   Complete by: As directed    Call MD for:  temperature >100.4   Complete by: As directed    Call MD for:  temperature >100.4   Complete by: As directed    Diet - low sodium heart healthy   Complete by: As directed    Discharge instructions   Complete by: As directed    Ophelia Charter MD, MPH [facility] [address], [address] [phone] (tel)   [phone] (fax)   POST-OPERATIVE INSTRUCTIONS - SHOULDER ARTHROSCOPY  WOUND CARE - You may remove the Operative Dressing on Post-Op Day #3 (72hrs after surgery).   - Alternatively if you would like you can leave dressing on until follow-up if within 7-8 days but keep it dry. - Leave steri-strips in place until they fall off on their own, usually 2 weeks postop. - There may be  a small amount of fluid/bleeding leaking at the surgical site. This is normal; the shoulder is filled with fluid during the procedure and can leak for 24-48hrs after surgery.  - You may change/reinforce the bandage as needed.  - Use the Cryocuff or Ice as often as possible for the first 7 days, then as needed for pain relief. Always keep a towel, ACE wrap or other barrier between the cooling  unit and your skin.  - You may shower on Post-Op Day #3. Gently pat the area dry. Do not soak the shoulder in water or submerge it. Keep dry incisions as dry as possible. - Do not go swimming in the pool or ocean until 4 weeks after surgery or when otherwise instructed.    EXERCISES/BRACING - Sling should be used at all times until follow-up.  - You can remove sling for hygiene.    - Please continue to ambulate and do not stay sitting or lying for too long. Perform foot and wrist pumps to assist in circulation.  POST-OP MEDICATIONS- Multimodal approach to pain control In general your pain will be controlled with a combination of substances.  Prescriptions unless otherwise discussed are electronically sent to your pharmacy.  This is a carefully made plan we use to minimize narcotic use.    - Meloxicam OR Celebrex - Anti-inflammatory medication taken on a scheduled basis - Acetaminophen - Non-narcotic pain medicine taken on a scheduled basis  - Oxycodone - This is a strong narcotic, to be used only on an "as needed" basis for pain. - Zofran - take as needed for nausea   FOLLOW-UP If you develop a Fever (=101.5), Redness or Drainage from the surgical incision site, please call our office to arrange for an evaluation. Please call the office to schedule a follow-up appointment for your suture removal, 10-14 days post-operatively.   ---   HELPFUL INFORMATION  If you had a block, it will wear off between 8-24 hrs postop typically.  This is period when your pain may go from nearly zero to the pain you would have had postop without the block.  This is an abrupt transition but nothing dangerous is happening.  You may take an extra dose of narcotic when this happens.  You may be more comfortable sleeping in a semi-seated position the first few nights following surgery.  Keep a pillow propped under the elbow and forearm for comfort.  If you have a recliner type of chair it might be beneficial.  If  not that is fine too, but it would be helpful to sleep propped up with pillows behind your operated shoulder as well under your elbow and forearm.  This will reduce pulling on the suture lines.  When dressing, put your operative arm in the sleeve first.  When getting undressed, take your operative arm out last.  Loose fitting, button-down shirts are recommended.  Often in the first days after surgery you may be more comfortable keeping your operative arm under your shirt and not through the sleeve.  You may return to work/school in the next couple of days when you feel up to it.  Desk work and typing in the sling is fine.  We suggest you use the pain medication the first night prior to going to bed, in order to ease any pain when the anesthesia wears off. You should avoid taking pain medications on an empty stomach as it will make you nauseous.  You should wean off your narcotic medicines as soon as  you are able.  Most patients will be off or using minimal narcotics before their first postop appointment.   Do not drink alcoholic beverages or take illicit drugs when taking pain medications.  It is against the law to drive while taking narcotics.  In some states it is against the law to drive while your arm is in a sling.   Pain medication may make you constipated.  Below are a few solutions to try in this order: Decrease the amount of pain medication if you aren't having pain. Drink lots of decaffeinated fluids. Drink prune juice and/or each dried prunes  If the first 3 don't work start with additional solutions Take Colace - an over-the-counter stool softener Take Senokot - an over-the-counter laxative Take Miralax - a stronger over-the-counter laxative     Allergies as of 12/18/2019      Reactions   Beef-derived Products Anaphylaxis   Doxycycline    Headache       Medication List    TAKE these medications   acetaminophen 500 MG tablet Commonly known as: TYLENOL Take 2 tablets  (1,000 mg total) by mouth every 8 (eight) hours for 14 days.   aspirin EC 81 MG tablet Take 81 mg by mouth daily.   FLUoxetine 10 MG capsule Commonly known as: PROZAC Take 10 mg by mouth daily.   FLUoxetine 40 MG capsule Commonly known as: PROZAC Take 40 mg by mouth daily.   gabapentin 600 MG tablet Commonly known as: NEURONTIN Take 600 mg by mouth 3 (three) times daily.   lisinopril 40 MG tablet Commonly known as: ZESTRIL Take 40 mg by mouth daily.   meloxicam 7.5 MG tablet Commonly known as: Mobic Take 1 tablet (7.5 mg total) by mouth daily.   ondansetron 4 MG tablet Commonly known as: Zofran Take 1 tablet (4 mg total) by mouth every 8 (eight) hours as needed for up to 7 days for nausea or vomiting.   oxyCODONE 5 MG immediate release tablet Commonly known as: Oxy IR/ROXICODONE Take 1-2 pills every 6 hrs as needed for pain, no more than 6 per day   Ozempic (0.25 or 0.5 MG/DOSE) 2 MG/1.5ML Sopn Generic drug: Semaglutide(0.25 or 0.5MG/DOS) Inject 0.5 mg into the skin once a week.   Testosterone Cypionate 200 MG/ML Kit Inject 200 mg into the muscle every 14 (fourteen) days.

## 2019-12-18 NOTE — Plan of Care (Signed)
